# Patient Record
Sex: Female | Born: 1950 | Race: White | Hispanic: No | Marital: Married | Smoking: Never smoker
Health system: Southern US, Community
[De-identification: ages and names within clinical notes are randomized; demographics above are authoritative.]

## PROBLEM LIST (undated history)

## (undated) DIAGNOSIS — E785 Hyperlipidemia, unspecified: Secondary | ICD-10-CM

## (undated) DIAGNOSIS — K589 Irritable bowel syndrome without diarrhea: Secondary | ICD-10-CM

## (undated) DIAGNOSIS — K2270 Barrett's esophagus without dysplasia: Secondary | ICD-10-CM

## (undated) DIAGNOSIS — F329 Major depressive disorder, single episode, unspecified: Secondary | ICD-10-CM

## (undated) DIAGNOSIS — M1990 Unspecified osteoarthritis, unspecified site: Secondary | ICD-10-CM

## (undated) DIAGNOSIS — J45909 Unspecified asthma, uncomplicated: Secondary | ICD-10-CM

## (undated) DIAGNOSIS — N809 Endometriosis, unspecified: Secondary | ICD-10-CM

## (undated) DIAGNOSIS — K219 Gastro-esophageal reflux disease without esophagitis: Secondary | ICD-10-CM

## (undated) DIAGNOSIS — I10 Essential (primary) hypertension: Secondary | ICD-10-CM

## (undated) DIAGNOSIS — I472 Ventricular tachycardia: Secondary | ICD-10-CM

## (undated) DIAGNOSIS — G2581 Restless legs syndrome: Secondary | ICD-10-CM

## (undated) DIAGNOSIS — K449 Diaphragmatic hernia without obstruction or gangrene: Secondary | ICD-10-CM

## (undated) DIAGNOSIS — F319 Bipolar disorder, unspecified: Secondary | ICD-10-CM

## (undated) DIAGNOSIS — I493 Ventricular premature depolarization: Secondary | ICD-10-CM

## (undated) DIAGNOSIS — R55 Syncope and collapse: Secondary | ICD-10-CM

## (undated) DIAGNOSIS — M797 Fibromyalgia: Secondary | ICD-10-CM

## (undated) DIAGNOSIS — F32A Depression, unspecified: Secondary | ICD-10-CM

## (undated) DIAGNOSIS — K5900 Constipation, unspecified: Secondary | ICD-10-CM

## (undated) DIAGNOSIS — I4729 Other ventricular tachycardia: Secondary | ICD-10-CM

## (undated) HISTORY — DX: Ventricular premature depolarization: I49.3

## (undated) HISTORY — DX: Unspecified asthma, uncomplicated: J45.909

## (undated) HISTORY — DX: Barrett's esophagus without dysplasia: K22.70

## (undated) HISTORY — PX: ABDOMINOPLASTY: SUR9

## (undated) HISTORY — PX: COLONOSCOPY: SHX174

## (undated) HISTORY — DX: Syncope and collapse: R55

## (undated) HISTORY — DX: Essential (primary) hypertension: I10

## (undated) HISTORY — DX: Depression, unspecified: F32.A

## (undated) HISTORY — DX: Endometriosis, unspecified: N80.9

## (undated) HISTORY — PX: UPPER GASTROINTESTINAL ENDOSCOPY: SHX188

## (undated) HISTORY — DX: Hyperlipidemia, unspecified: E78.5

## (undated) HISTORY — DX: Irritable bowel syndrome, unspecified: K58.9

## (undated) HISTORY — DX: Other ventricular tachycardia: I47.29

## (undated) HISTORY — DX: Bipolar disorder, unspecified: F31.9

## (undated) HISTORY — DX: Diaphragmatic hernia without obstruction or gangrene: K44.9

## (undated) HISTORY — DX: Ventricular tachycardia: I47.2

## (undated) HISTORY — DX: Constipation, unspecified: K59.00

## (undated) HISTORY — DX: Restless legs syndrome: G25.81

## (undated) HISTORY — DX: Fibromyalgia: M79.7

## (undated) HISTORY — DX: Major depressive disorder, single episode, unspecified: F32.9

## (undated) HISTORY — PX: APPENDECTOMY: SHX54

## (undated) HISTORY — DX: Gastro-esophageal reflux disease without esophagitis: K21.9

## (undated) HISTORY — PX: LAPAROSCOPIC TOTAL HYSTERECTOMY: SUR800

## (undated) HISTORY — DX: Unspecified osteoarthritis, unspecified site: M19.90

---

## 1998-10-02 ENCOUNTER — Other Ambulatory Visit: Admission: RE | Admit: 1998-10-02 | Discharge: 1998-10-02 | Payer: Self-pay | Admitting: Obstetrics & Gynecology

## 2000-04-27 ENCOUNTER — Other Ambulatory Visit: Admission: RE | Admit: 2000-04-27 | Discharge: 2000-04-27 | Payer: Self-pay | Admitting: Obstetrics & Gynecology

## 2000-08-18 ENCOUNTER — Ambulatory Visit (HOSPITAL_COMMUNITY): Admission: RE | Admit: 2000-08-18 | Discharge: 2000-08-18 | Payer: Self-pay | Admitting: Obstetrics & Gynecology

## 2001-06-05 ENCOUNTER — Other Ambulatory Visit: Admission: RE | Admit: 2001-06-05 | Discharge: 2001-06-05 | Payer: Self-pay | Admitting: Obstetrics & Gynecology

## 2002-02-25 ENCOUNTER — Encounter: Admission: RE | Admit: 2002-02-25 | Discharge: 2002-02-25 | Payer: Self-pay | Admitting: Internal Medicine

## 2002-02-25 ENCOUNTER — Encounter: Payer: Self-pay | Admitting: Internal Medicine

## 2002-05-13 ENCOUNTER — Ambulatory Visit (HOSPITAL_COMMUNITY): Admission: RE | Admit: 2002-05-13 | Discharge: 2002-05-13 | Payer: Self-pay | Admitting: Internal Medicine

## 2002-05-13 ENCOUNTER — Encounter: Payer: Self-pay | Admitting: Internal Medicine

## 2002-09-11 ENCOUNTER — Other Ambulatory Visit: Admission: RE | Admit: 2002-09-11 | Discharge: 2002-09-11 | Payer: Self-pay | Admitting: Obstetrics & Gynecology

## 2002-10-02 ENCOUNTER — Encounter: Admission: RE | Admit: 2002-10-02 | Discharge: 2002-10-02 | Payer: Self-pay | Admitting: Obstetrics & Gynecology

## 2002-10-02 ENCOUNTER — Encounter: Payer: Self-pay | Admitting: Obstetrics & Gynecology

## 2003-10-08 ENCOUNTER — Other Ambulatory Visit: Admission: RE | Admit: 2003-10-08 | Discharge: 2003-10-08 | Payer: Self-pay | Admitting: Obstetrics and Gynecology

## 2003-10-14 ENCOUNTER — Encounter: Admission: RE | Admit: 2003-10-14 | Discharge: 2003-10-14 | Payer: Self-pay | Admitting: Obstetrics & Gynecology

## 2003-11-18 ENCOUNTER — Ambulatory Visit (HOSPITAL_COMMUNITY): Admission: RE | Admit: 2003-11-18 | Discharge: 2003-11-18 | Payer: Self-pay | Admitting: Internal Medicine

## 2004-09-01 ENCOUNTER — Ambulatory Visit: Payer: Self-pay | Admitting: Internal Medicine

## 2004-10-05 ENCOUNTER — Ambulatory Visit: Payer: Self-pay | Admitting: Internal Medicine

## 2009-05-05 DIAGNOSIS — Z8669 Personal history of other diseases of the nervous system and sense organs: Secondary | ICD-10-CM

## 2009-05-05 DIAGNOSIS — Z862 Personal history of diseases of the blood and blood-forming organs and certain disorders involving the immune mechanism: Secondary | ICD-10-CM

## 2009-05-05 DIAGNOSIS — M1990 Unspecified osteoarthritis, unspecified site: Secondary | ICD-10-CM | POA: Insufficient documentation

## 2009-05-05 DIAGNOSIS — I472 Ventricular tachycardia: Secondary | ICD-10-CM

## 2009-05-05 DIAGNOSIS — M810 Age-related osteoporosis without current pathological fracture: Secondary | ICD-10-CM | POA: Insufficient documentation

## 2009-05-05 DIAGNOSIS — R7309 Other abnormal glucose: Secondary | ICD-10-CM | POA: Insufficient documentation

## 2009-05-05 DIAGNOSIS — Z8639 Personal history of other endocrine, nutritional and metabolic disease: Secondary | ICD-10-CM | POA: Insufficient documentation

## 2009-05-05 DIAGNOSIS — Z8719 Personal history of other diseases of the digestive system: Secondary | ICD-10-CM

## 2009-05-06 ENCOUNTER — Ambulatory Visit: Payer: Self-pay | Admitting: Cardiology

## 2009-05-06 DIAGNOSIS — R002 Palpitations: Secondary | ICD-10-CM | POA: Insufficient documentation

## 2009-05-06 DIAGNOSIS — E785 Hyperlipidemia, unspecified: Secondary | ICD-10-CM | POA: Insufficient documentation

## 2009-05-06 DIAGNOSIS — R079 Chest pain, unspecified: Secondary | ICD-10-CM | POA: Insufficient documentation

## 2009-05-12 ENCOUNTER — Telehealth (INDEPENDENT_AMBULATORY_CARE_PROVIDER_SITE_OTHER): Payer: Self-pay | Admitting: Radiology

## 2009-06-16 ENCOUNTER — Telehealth (INDEPENDENT_AMBULATORY_CARE_PROVIDER_SITE_OTHER): Payer: Self-pay

## 2009-06-17 ENCOUNTER — Ambulatory Visit: Payer: Self-pay

## 2009-06-17 ENCOUNTER — Encounter: Payer: Self-pay | Admitting: Cardiovascular Disease

## 2009-06-23 ENCOUNTER — Ambulatory Visit: Payer: Self-pay | Admitting: Cardiology

## 2009-06-23 DIAGNOSIS — I10 Essential (primary) hypertension: Secondary | ICD-10-CM | POA: Insufficient documentation

## 2009-10-15 ENCOUNTER — Encounter (INDEPENDENT_AMBULATORY_CARE_PROVIDER_SITE_OTHER): Payer: Self-pay | Admitting: *Deleted

## 2010-03-01 ENCOUNTER — Telehealth: Payer: Self-pay | Admitting: Internal Medicine

## 2010-03-10 ENCOUNTER — Encounter (INDEPENDENT_AMBULATORY_CARE_PROVIDER_SITE_OTHER): Payer: Self-pay | Admitting: *Deleted

## 2010-03-17 ENCOUNTER — Encounter (INDEPENDENT_AMBULATORY_CARE_PROVIDER_SITE_OTHER): Payer: Self-pay | Admitting: *Deleted

## 2010-03-24 ENCOUNTER — Ambulatory Visit: Payer: Self-pay | Admitting: Internal Medicine

## 2010-03-29 ENCOUNTER — Ambulatory Visit: Payer: Self-pay | Admitting: Internal Medicine

## 2010-11-14 ENCOUNTER — Encounter: Payer: Self-pay | Admitting: Specialist

## 2010-11-23 NOTE — Progress Notes (Signed)
Summary: Schedule Colonoscopy  Phone Note Outgoing Call Call back at [facility] Phone [phone]   Call placed by: Harlow Mares CMA Duncan Dull),  Mar 01, 2010 2:56 PM Call placed to: Patient Summary of Call: Left message on patients machine to call back. patient is due for colonoscopy. Initial call taken by: Harlow Mares CMA Duncan Dull),  Mar 01, 2010 2:56 PM  Follow-up for Phone Call        pt scheduled for colonoscopy on 03/29/2010 Follow-up by: Harlow Mares CMA (AAMA),  Mar 10, 2010 11:21 AM

## 2010-11-23 NOTE — Letter (Signed)
Summary: Previsit letter  [facility]  [address] [place], [place] [address]   Phone: [phone]  Fax: [phone]       03/10/2010 MRN: [id number]  [facility] [address] [place], [place]  [address]  Dear Ms. Czajka,  Welcome to the Gastroenterology Division at [facility].    You are scheduled to see a nurse for your pre-procedure visit on 03/24/2010 at 1:00pm on the 3rd floor at [facility], [address]  We ask that you try to arrive at our office 15 minutes prior to your appointment time to allow for check-in.  Your nurse visit will consist of discussing your medical and surgical history, your immediate family medical history, and your medications.    Please bring a complete list of all your medications or, if you prefer, bring the medication bottles and we will list them.  We will need to be aware of both prescribed and over the counter drugs.  We will need to know exact dosage information as well.  If you are on blood thinners (Coumadin, Plavix, Aggrenox, Ticlid, etc.) please call our office today/prior to your appointment, as we need to consult with your physician about holding your medication.   Please be prepared to read and sign documents such as consent forms, a financial agreement, and acknowledgement forms.  If necessary, and with your consent, a friend or relative is welcome to sit-in on the nurse visit with you.  Please bring your insurance card so that we may make a copy of it.  If your insurance requires a referral to see a specialist, please bring your referral form from your primary care physician.  No co-pay is required for this nurse visit.     If you cannot keep your appointment, please call [phone] to cancel or reschedule prior to your appointment date.  This allows [place] the opportunity to schedule an appointment for another patient in need of care.    Thank you for choosing [facility] for your medical needs.  We  appreciate the opportunity to care for you.  Please visit [place] at our website  to learn more about our practice.                     Sincerely.                                                                                                                   The Gastroenterology Division

## 2010-11-23 NOTE — Procedures (Signed)
Summary: Colonoscopy  Patient: Jill Howe Note: All result statuses are Final unless otherwise noted.  Tests: (1) Colonoscopy (COL)   COL Colonoscopy           DONE     [facility]     [address]     [place], [place]  [address]           COLONOSCOPY PROCEDURE REPORT           PATIENT:  Jill Howe, Jill Howe  MR#:  [id number]     BIRTHDATE:  1951/06/21, 59 yrs. old  GENDER:  female     ENDOSCOPIST:  Wilhemina Bonito. Eda Keys, MD     REF. BY:  Screening Recall     PROCEDURE DATE:  03/29/2010     PROCEDURE:  Higher-risk screening colonoscopy G0105           ASA CLASS:  Class II     INDICATIONS:  family history of colon cancer ; Multiple paternal     relatives including father in 95's (negative index exam 12-05)     MEDICATIONS:   Fentanyl 100 mcg IV, Versed 10 mg IV           DESCRIPTION OF PROCEDURE:   After the risks benefits and     alternatives of the procedure were thoroughly explained, informed     consent was obtained.  Digital rectal exam was performed and     revealed no abnormalities.   The LB PCF-H180AL [id number] endoscope     was introduced through the anus and advanced to the cecum, which     was identified by both the appendix and ileocecal valve, without     limitations.Time to cecum = 14:15 min. The quality of the prep was     excellent, using MoviPrep.  The instrument was then slowly     withdrawn (time = 10:51 min) as the colon was fully examined.     <<PROCEDUREIMAGES>>           FINDINGS:  A normal appearing cecum, ileocecal valve, and     appendiceal orifice were identified. The ascending, hepatic     flexure, transverse, splenic flexure, descending, sigmoid colon,     and rectum appeared unremarkable.  No polyps or cancers were seen.     Retroflexed views in the rectum revealed hypertrophied anal     papillae.    The scope was then withdrawn from the patient and the     procedure completed.           COMPLICATIONS:  None     ENDOSCOPIC IMPRESSION:     1)  Normal colon     2) No polyps or cancers           RECOMMENDATIONS:     1) Follow up colonoscopy in 5 years           ______________________________     Wilhemina Bonito. Eda Keys, MD           CC:  Feliciana Rossetti, MD; The Patient           n.     eSIGNED:   Wilhemina Bonito. Eda Keys at 03/29/2010 02:03 PM           Lauris, Keepers, [phone]  Note: An exclamation mark (!) indicates a result that was not dispersed into the flowsheet. Document Creation Date: 03/29/2010 2:04 PM _______________________________________________________________________  (1) Order result status: Final Collection or observation date-time: 03/29/2010 13:56 Requested date-time:  Receipt date-time:  Reported date-time:  Referring Physician:   Ordering Physician: Fransico Setters [phone]) Specimen Source:  Source: Launa Grill Order Number: [id number] Lab site:   Appended Document: Colonoscopy    Clinical Lists Changes  Observations: Added new observation of COLONNXTDUE: 03/2015 (03/29/2010 16:09)

## 2010-11-23 NOTE — Letter (Signed)
Summary: [facility] Instructions  [facility]  [address] [place], [place] [address]   Phone: [phone]  Fax: [phone]       Jill Howe    1951-04-19    MRN: [id number]        Procedure Day Dorna Bloom:  Duanne Limerick  03/29/10     Arrival Time:  12:30PM     Procedure Time:  1:30PM     Location of Procedure:                    _X _  [facility] (4th Floor)                        PREPARATION FOR COLONOSCOPY WITH MOVIPREP   Starting 5 days prior to your procedure 03/24/10 do not eat nuts, seeds, popcorn, corn, beans, peas,  salads, or any raw vegetables.  Do not take any fiber supplements (e.g. Metamucil, Citrucel, and Benefiber).  THE DAY BEFORE YOUR PROCEDURE         DATE: 03/28/10  DAY: SUNDAY  1.  Drink clear liquids the entire day-NO SOLID FOOD  2.  Do not drink anything colored red or purple.  Avoid juices with pulp.  No orange juice.  3.  Drink at least 64 oz. (8 glasses) of fluid/clear liquids during the day to prevent dehydration and help the prep work efficiently.  CLEAR LIQUIDS INCLUDE: Water Jello Ice Popsicles Tea (sugar ok, no milk/cream) Powdered fruit flavored drinks Coffee (sugar ok, no milk/cream) Gatorade Juice: apple, Hutchinson grape, Greenley cranberry  Lemonade Clear bullion, consomm, broth Carbonated beverages (any kind) Strained chicken noodle soup Hard Candy                             4.  In the morning, mix first dose of MoviPrep solution:    Empty 1 Pouch A and 1 Pouch B into the disposable container    Add lukewarm drinking water to the top line of the container. Mix to dissolve    Refrigerate (mixed solution should be used within 24 hrs)  5.  Begin drinking the prep at 5:00 p.m. The MoviPrep container is divided by 4 marks.   Every 15 minutes drink the solution down to the next mark (approximately 8 oz) until the full liter is complete.   6.  Follow completed prep with 16 oz of clear liquid of your choice (Nothing red  or purple).  Continue to drink clear liquids until bedtime.  7.  Before going to bed, mix second dose of MoviPrep solution:    Empty 1 Pouch A and 1 Pouch B into the disposable container    Add lukewarm drinking water to the top line of the container. Mix to dissolve    Refrigerate  THE DAY OF YOUR PROCEDURE      DATE: 03/29/10  DAY: MONDAY  Beginning at 8:30AM (5 hours before procedure):         1. Every 15 minutes, drink the solution down to the next mark (approx 8 oz) until the full liter is complete.  2. Follow completed prep with 16 oz. of clear liquid of your choice.    3. You may drink clear liquids until 11:30AM (2 HOURS BEFORE PROCEDURE).   MEDICATION INSTRUCTIONS  Unless otherwise instructed, you should take regular prescription medications with a small sip of water   as early as possible the morning  of your procedure.        OTHER INSTRUCTIONS  You will need a responsible adult at least 59 years of age to accompany you and drive you home.   This person must remain in the waiting room during your procedure.  Wear loose fitting clothing that is easily removed.  Leave jewelry and other valuables at home.  However, you may wish to bring a book to read or  an iPod/MP3 player to listen to music as you wait for your procedure to start.  Remove all body piercing jewelry and leave at home.  Total time from sign-in until discharge is approximately 2-3 hours.  You should go home directly after your procedure and rest.  You can resume normal activities the  day after your procedure.  The day of your procedure you should not:   Drive   Make legal decisions   Operate machinery   Drink alcohol   Return to work  You will receive specific instructions about eating, activities and medications before you leave.    The above instructions have been reviewed and explained to me by   Wyona Almas RN  March 24, 2010 1:30 PM     I fully understand and can  verbalize these instructions _____________________________ Date _________

## 2010-11-23 NOTE — Miscellaneous (Signed)
Summary: [facility] Previsit/prep  Clinical Lists Changes  Medications: Added new medication of MOVIPREP 100 GM  SOLR (PEG-KCL-NACL-NASULF-NA ASC-C) As per prep instructions. - Signed Rx of MOVIPREP 100 GM  SOLR (PEG-KCL-NACL-NASULF-NA ASC-C) As per prep instructions.;  #1 x 0;  Signed;  Entered by: Wyona Almas RN;  Authorized by: Hilarie Fredrickson MD;  Method used: Electronically to [facility] [address] [place], [place]  [address], Ph: [phone], Fax: [phone] Observations: Added new observation of ALLERGY REV: Done (03/24/2010 13:01)    Prescriptions: MOVIPREP 100 GM  SOLR (PEG-KCL-NACL-NASULF-NA ASC-C) As per prep instructions.  #1 x 0   Entered by:   Wyona Almas RN   Authorized by:   Hilarie Fredrickson MD   Signed by:   Wyona Almas RN on 03/24/2010   Method used:   Electronically to        [facility]* (retail)       [address]       [place], [place]  [address]       Ph: [phone]       Fax: [phone]   RxID:   [id number]

## 2011-03-11 NOTE — Op Note (Signed)
NAME:  Jill Howe, Jill Howe                           ACCOUNT NO.:  [id number]   MEDICAL RECORD NO.:  [id number]                   PATIENT TYPE:  OIB   LOCATION:  2899                                 FACILITY:  [facility]   PHYSICIAN:  Doylene Canning. Ladona Ridgel, M.D.               DATE OF BIRTH:  [birth year]   DATE OF PROCEDURE:  11/18/2003  DATE OF DISCHARGE:                                 OPERATIVE REPORT   PROCEDURE PERFORMED:  Head up tilt table testing.   INDICATIONS FOR PROCEDURE:  Recurrent unexplained syncope and recurrent  episodes of near syncope.   INTRODUCTION:  The patient is a 60 year old woman referred by Pricilla Riffle,  M.D. for evaluation of syncope.  The patient has been previously healthy  although she does have problems with palpitations.  She has dyslipidemia.  She has had two episodes of syncope, one while sitting and one while  standing and recurrent episodes of near syncope which improved with sitting.  She also has prolonged palpitations with documented bigeminy and frequent  PVCs and even nonsustained VT despite normal LV systolic function.  She has  been treated in the past for these with Beta blockers.  She is now referred  for head up tilt table testing.   DESCRIPTION OF PROCEDURE:  After informed consent was obtained, the patient  was taken to the diagnostic catheterization lab in the fasting state.  She  was placed in the supine position.  Her initial blood pressure was 127/85  and her heart rate was 69.  After five minutes she was placed in 70 degree  head up tilt table position.  Her initial heart rate remained stable in the  60s to 70 range and then approximately four minutes of tilting, increased to  93 beats per minute.  Her blood pressure which initially had been in the 120  to 130 range remained stable.  After approximately 26 minutes of tilting,  the patient began to feel lightheaded and nauseated and had worsening  headache.  Her heart rate increased from the 80s  to the 90s.  The blood  pressure remained fairly stable, dropping slightly down to the 110 to 120  range.  She was maintained in this position for approximately 38 minutes  when she began to complain of blurry vision.  During this time, her heart  rate remained in the 80s and her blood pressure remained in the 120 to 130  range.  She continued to have symptoms of dizziness and nausea but  maintained her heart rate and blood pressure.  In fact her blood pressure  actually increased up into the 140 to 150 range and her heart rate increased  to the 100 to 110 range.  At approximately 45 minutes into tilting, the  patient complained of dizziness and was quite tearful and became  unresponsive.  The nurses note that her eyes rolled back into her head.  During this time, her heart rate remained in the 100 range and her blood  pressure remained in the 150 to 170 range.  She was placed back into supine  position.  She was quite tearful.  She remained somewhat nauseated.  After  approximately five minutes, she had a return of her heart rate into the 80s  and her blood pressure into the 130 range.  She was then returned to her  room in satisfactory condition.   COMPLICATIONS:  There were no immediate procedural complications.   RESULTS:  This study demonstrates a reproduction of the patient's symptoms  of syncope; however, it failed to demonstrate any hemodynamic changes  consistent with an early mediated syncope.  The heart rate and blood  pressure did not drop and in fact remained somewhat increased during the  procedure.  Because of a reproduction of her symptoms and because she  maintained stable vitals during the procedure, it was deemed that insertion  of an implantable loop recorder would not be beneficial at this time in this  particular patient.                                               Doylene Canning. Ladona Ridgel, M.D.    GWT/MEDQ  D:  11/18/2003  T:  11/18/2003  Job:  [id number]   cc:   Pricilla Riffle, M.D.   Dr. Jennette Kettle   Dr. Brien Few, [facility]

## 2011-03-11 NOTE — Op Note (Signed)
[facility]  Patient:    Jill Howe, Jill Howe                          MRN: [id number] Proc. Date: 08/18/00 Adm. Date:  08/18/00 Attending:  Freddy Finner, M.D.                           Operative Report  PREOPERATIVE DIAGNOSES:       1. Complex cystic left adnexal mass, persistent                                  on ultrasound.                               2. Persistent left adnexal and pelvic pain.  POSTOPERATIVE DIAGNOSES:      1. Boggy slight enlargement of the uterus.                               2. Filmy omental adhesions to anterior abdominal                                  wall into right pelvic side wall and right                                  adnexa.                               3. Adhesion distal fallopian tube to sigmoid                                  colon with endometriotic implant at the                                  location of the adhesion.                               4. Small endometrioma of the left ovary.  OPERATIVE PROCEDURE:          1. Diagnostic laparoscopy.                               2. Lysis of omental adhesions to the anterior                                  abdominal wall.                               3. Lysis of adhesions from the distal  fallopian tube to the sigmoid colon.                               4. Fulguration of endometriosis at this                                  location.                               5. Fulguration and drainage of endometrioma                                  of the left ovary.  SURGEON:                      Freddy Finner, M.D.  ANESTHESIA:                   General endotracheal.  ESTIMATED INTRAOPERATIVE BLOOD LOSS:  Less than 20 cc.  INTRAOPERATIVE COMPLICATIONS:  None.  INDICATIONS:                  The patient is a 60 year old [name] married female who has previously had a surgical resection of the right fallopian tube and ovary.  In  the recent past she has had persistent left adnexal pain and a small complex cystic mass in the left adnexa.  She has had consistent dyspareunia.  She has elected to proceed a with diagnostic laparoscopy.  INTRAOPERATIVE FINDINGS:      Are described in the postoperative diagnoses. The findings were recorded and still photographs were taken of the office record.  DESCRIPTION OF PROCEDURE:     The patient was admitted on the morning of surgery and brought to the operating room and placed under adequate general endotracheal anesthesia.  She was placed in the dorsal lithotomy position using the [employer] system.  Betadine prep of the abdomen, perineum, and vagina was carried out in the usual fashion.  The bladder was evaluated with the [facility] catheter using a sterile technique.  The Hulka tenaculum was attached to the cervix under direct visualization.  Sterile drapes were applied.  Two small incisions were made, one at the umbilicus, and one just above the symphysis.  A 10.0 mm trocar was introduced through the umbilical incision while elevating the anterior abdominal wall manually.  Direct inspection revealed adequate placement with no evidence of injury on entry. The pneumoperitoneum was allowed to accumulate using carbon dioxide gas.  A second trocar was placed through the lower incision, which was just above the symphysis.  At this location the 5.0 mm trocar was used.  A blunt probe, grasping forceps, and later the Nezhat irrigation system was used through this port.  After careful inspection of the pelvic and abdominal contents, the adhesions of the omentum to the anterior abdominal wall were sharply lysed where required.  Bipolar coagulation produced complete hemostasis.  The lesion attaching the distal infundibular pelvic ligament and tube to the sigmoid was fulgurated and divided sharply.  The cystic lesion there consistent with endometriosis was fulgurated.  A small bluish  cystic area on the proximal portion of the ovary just distal to the utero-ovarian ligament was fulgurated, and immediate ruptured, with chocolate-like material, total volume  of less than 1.0 cc.  Fulguration was continued until it was felt that the lesion was completely destroyed.  Irrigation using lactated Ringers solution through the Nezhat probe was used to cleanse the pelvic structures and to evaluate for hemostasis which was confirmed under irrigation and under reduced intra-abdominal pressure.  At this point all irrigation solution was aspirated from the abdomen.  The instruments were removed.  The tissue at the incision sites was injected with 0.5% plain Marcaine for postoperative analgesia.  The umbilical incision was closed with interrupted subcuticular sutures of #3-0 Dexon.  Dermabond was then applied over the incision.  The lower incision was closed with Dermabond alone.  The patient was awakened and taken to the recovery room in good condition. She will be discharged with routine outpatient surgical instructions, for followup in the office in two weeks.  She is given Darvocet to be taken as needed for postoperative pain. DD:  08/18/00 TD:  08/18/00 Job: [id number]

## 2013-04-05 ENCOUNTER — Telehealth: Payer: Self-pay | Admitting: Internal Medicine

## 2013-04-05 NOTE — Telephone Encounter (Signed)
Pt states she is having a lot of problems with constipation. States that she feels something hard and to the left in her rectum. States that when she tries to have a BM she uses an enema and she is even having problems getting the enema in her rectum. Also reports that when she does have a BM it is so large in diameter she cannot flush it. Pt requesting to be seen. Pt scheduled to see Amy Esterwood PA 04/10/13@1 :30pm.

## 2013-04-10 ENCOUNTER — Encounter: Payer: Self-pay | Admitting: *Deleted

## 2013-04-10 ENCOUNTER — Ambulatory Visit: Payer: Self-pay | Admitting: Physician Assistant

## 2013-04-11 ENCOUNTER — Ambulatory Visit (INDEPENDENT_AMBULATORY_CARE_PROVIDER_SITE_OTHER): Payer: Self-pay | Admitting: Physician Assistant

## 2013-04-11 ENCOUNTER — Encounter: Payer: Self-pay | Admitting: Physician Assistant

## 2013-04-11 VITALS — BP 114/68 | HR 72 | Ht 62.5 in | Wt 124.1 lb

## 2013-04-11 DIAGNOSIS — R198 Other specified symptoms and signs involving the digestive system and abdomen: Secondary | ICD-10-CM

## 2013-04-11 DIAGNOSIS — K5900 Constipation, unspecified: Secondary | ICD-10-CM

## 2013-04-11 NOTE — Progress Notes (Signed)
Subjective:    Patient ID: Jill Howe, female    DOB: 1951-09-15, 62 y.o.   MRN: [id number]  HPI  Jill Howe is a very nice 62 year old [place] female known to Dr. Marina Goodell from colonoscopy done in June of 2011. This was a normal exam with the exception of remarkable of hypertrophied anal papillae. She does have family history of colon cancer in her father diagnosed in his 61 . Patient says that she has chronic history of constipation dating back as long as she can remember she has tried multiple medications in the past without any benefit according to her. This included MiraLax and on Amitiza. She says that her symptoms have become progressively worse over the past several months and she is now going 10-12 days without a bowel movement and even then has to use enemas and do manual manipulation of the rectum to expel any stool.She has just had an episode going 13 days without a bowel movement and finally last evening with enemas etc. she was able to pass Hard large balls of stool followed by a large amount of more normal appearing formed stool. she says she frequently has bleeding with bowel movements because of straining using enemas etc.. She just had an abdominal plasty done about 4 weeks ago but says she was not taking any pain medication etc. She does take a stool softener generally 4 tablets once daily . She describes a sensation of a fullness in her left rectum which is pushing the stool to the right. She says she consents this when she is sitting and also when she is trying to eliminate stool She is status post hysterectomy.      Review of Systems  Constitutional: Negative.   HENT: Negative.   Respiratory: Negative.   Cardiovascular: Negative.   Gastrointestinal: Positive for constipation, anal bleeding and rectal pain.  Endocrine: Negative.   Genitourinary: Negative.   Hematological: Negative.   Psychiatric/Behavioral: Negative.    Outpatient Encounter Prescriptions as of 04/11/2013   Medication Sig Dispense Refill  . estradiol (ESTRACE) 1 MG tablet Take 1 mg by mouth daily.       . hydrochlorothiazide (HYDRODIURIL) 25 MG tablet Take 25 mg by mouth daily.      Marland Kitchen lisinopril (PRINIVIL,ZESTRIL) 20 MG tablet Take 20 mg by mouth daily.      . methocarbamol (ROBAXIN) 500 MG tablet Take 500 mg by mouth as needed.       . simvastatin (ZOCOR) 20 MG tablet Take 20 mg by mouth daily.       Marland Kitchen TAZORAC 0.1 % gel Apply topically as needed.       . [DISCONTINUED] enoxaparin (LOVENOX) 40 MG/0.4ML injection       . [DISCONTINUED] HYDROmorphone (DILAUDID) 2 MG tablet        No facility-administered encounter medications on file as of 04/11/2013.    Allergies  Allergen Reactions  . Bextra (Valdecoxib)        Patient Active Problem List   Diagnosis Date Noted  . HYPERTENSION, UNSPECIFIED 06/23/2009  . HYPERLIPIDEMIA-MIXED 05/06/2009  . PALPITATIONS 05/06/2009  . CHEST PAIN UNSPECIFIED 05/06/2009  . VENTRICULAR TACHYCARDIA 05/05/2009  . DEGENERATIVE JOINT DISEASE 05/05/2009  . OSTEOPOROSIS 05/05/2009  . DIABETES MELLITUS, BORDERLINE 05/05/2009  . HYPERPARATHYROIDISM, HX OF 05/05/2009  . SYNCOPE, HX OF 05/05/2009  . GASTROESOPHAGEAL REFLUX DISEASE, HX OF 05/05/2009   History   Social History Narrative  . No narrative on file   family history includes Breast cancer in her cousin; Colon  cancer in her cousin, father, and paternal aunt; Crohn's disease in her cousin; Diabetes in her father and paternal aunt; Heart attack (age of onset: 57) in her father; Heart attack (age of onset: 80) in her mother; Heart disease in her mother; Lymphoma in her brother; and Stroke in her brother and mother.  Objective:   Physical Exam  well-developed [place] female in no acute distress, pleasant blood pressure 114/68 pulse 72 height 5 foot 2 weight 124. HEENT ;nontraumatic normocephalic EOMI PERRLA sclera anicteric,neck; Supple no JVD, Cardiovascular; regular rate and rhythm with S1-S2 no murmur rub  or gallop, Pulmonary; clear bilaterally. , Abdomen ;soft, she has a large transverse incisional scar from recent abdominal plasty, abdomen is benign nondistended- bowel sounds are active there is no palpable mass or hepatosplenomegaly., Rectal; no external hemorrhoids noted nontender on anoscopy cannot palpate fissure, no internal hemorrhoids noted. On digital exam she does have fullness in the left rectal wall which is tender with palpation but not fluctuant. No stool in the rectal vault to Hemoccult, Extremities;no clubbing cyanosis or edema skin warm and dry, Psych; mood and affect normal and appropriate        Assessment & Plan:  #102 62 year old [place] female with chronic constipation now severe and progressive. Patient has difficulty evacuating her bowels and requires manual maneuvers etc. suggesting a component of pelvic floor dysfunction or possible rectocele. #2 abnormal rectal exam with fullness in the left rectal wall. Patient also sentences that her stool is being pushed to the right. Unclear etiology need to rule out a submucosal lesion #3 positive family history of colon cancer-patient had normal colonoscopy June 2011  Plan; start Linzess 290 mcg by mouth daily patient was provided with samples Patient is instructed to take a bottle of mag citrate or 4-5 doses of MiraLax in one sitting every 5 days if she has not had a bowel movement. Will schedule for pelvic CT with rectal contrast She may need repeat colonoscopy with Dr. Marina Goodell depending on results of above.

## 2013-04-11 NOTE — Patient Instructions (Addendum)
Your CT is scheduled on 04/12/2013 at 2:30pm at [facility] CT on [address] Arrive 15 minutes before your appointment  2 bottles of contrast   Drink one bottle at 12:30pm Drink one bottle at 1:30pm  Samples of Linzess 290 Use magnesium citrate for constipation if that does not work try 5 doses of Miralax

## 2013-04-12 ENCOUNTER — Inpatient Hospital Stay: Admission: RE | Admit: 2013-04-12 | Payer: Self-pay | Source: Ambulatory Visit

## 2013-04-15 ENCOUNTER — Ambulatory Visit (INDEPENDENT_AMBULATORY_CARE_PROVIDER_SITE_OTHER)
Admission: RE | Admit: 2013-04-15 | Discharge: 2013-04-15 | Disposition: A | Discharge status: Home / Self Care | Payer: Medicare Other | Source: Ambulatory Visit | Attending: Physician Assistant | Admitting: Physician Assistant

## 2013-04-15 DIAGNOSIS — R198 Other specified symptoms and signs involving the digestive system and abdomen: Secondary | ICD-10-CM

## 2013-04-15 MED ORDER — IOHEXOL 300 MG/ML  SOLN
100.0000 mL | Freq: Once | INTRAMUSCULAR | Status: AC | PRN
  Administered 2013-04-15: 100 mL via INTRAVENOUS

## 2013-04-15 NOTE — Progress Notes (Signed)
Agree with initial assessment and plans as outlined. Amy, keep me apprised of results.

## 2013-04-16 ENCOUNTER — Encounter: Payer: Self-pay | Admitting: Internal Medicine

## 2013-04-16 ENCOUNTER — Other Ambulatory Visit: Payer: Self-pay | Admitting: *Deleted

## 2013-04-16 DIAGNOSIS — K5900 Constipation, unspecified: Secondary | ICD-10-CM

## 2013-04-16 DIAGNOSIS — R198 Other specified symptoms and signs involving the digestive system and abdomen: Secondary | ICD-10-CM

## 2013-04-18 ENCOUNTER — Ambulatory Visit (AMBULATORY_SURGERY_CENTER): Payer: Medicare Other | Admitting: *Deleted

## 2013-04-18 ENCOUNTER — Encounter: Payer: Self-pay | Admitting: Internal Medicine

## 2013-04-18 VITALS — Ht 63.0 in | Wt 124.0 lb

## 2013-04-18 DIAGNOSIS — R198 Other specified symptoms and signs involving the digestive system and abdomen: Secondary | ICD-10-CM

## 2013-04-18 DIAGNOSIS — Z800 Family history of malignant neoplasm of digestive organs: Secondary | ICD-10-CM

## 2013-04-18 DIAGNOSIS — K5900 Constipation, unspecified: Secondary | ICD-10-CM

## 2013-04-18 MED ORDER — MOVIPREP 100 G PO SOLR: 1.0000 | Freq: Once | ORAL | Status: DC

## 2013-04-18 NOTE — Progress Notes (Signed)
Denies allergies to eggs or soy products. Denies complications with sedation or anesthesia. 

## 2013-04-24 ENCOUNTER — Ambulatory Visit (AMBULATORY_SURGERY_CENTER): Payer: Medicare Other | Admitting: Internal Medicine

## 2013-04-24 ENCOUNTER — Encounter: Payer: Self-pay | Admitting: Internal Medicine

## 2013-04-24 VITALS — BP 137/72 | HR 67 | Temp 97.9°F | Resp 14 | Ht 63.0 in | Wt 124.0 lb

## 2013-04-24 DIAGNOSIS — K5900 Constipation, unspecified: Secondary | ICD-10-CM

## 2013-04-24 DIAGNOSIS — R198 Other specified symptoms and signs involving the digestive system and abdomen: Secondary | ICD-10-CM

## 2013-04-24 DIAGNOSIS — Z800 Family history of malignant neoplasm of digestive organs: Secondary | ICD-10-CM

## 2013-04-24 MED ORDER — SODIUM CHLORIDE 0.9 % IV SOLN: 500.0000 mL | INTRAVENOUS | Status: DC

## 2013-04-24 NOTE — Progress Notes (Signed)
Lidocaine-40mg IV prior to Propofol InductionPropofol given over incremental dosages 

## 2013-04-24 NOTE — Progress Notes (Signed)
No complaints noted in the recovery room. maw 

## 2013-04-24 NOTE — Patient Instructions (Addendum)
Refer to [facility] GI motility disorder section for refractory constipation.  Follow up colonoscopy for 5 years.  You may resume your current medications today.  Please call if any questions or concerns.    YOU HAD AN ENDOSCOPIC PROCEDURE TODAY AT THE [facility]: Refer to the procedure report that was given to you for any specific questions about what was found during the examination.  If the procedure report does not answer your questions, please call your gastroenterologist to clarify.  If you requested that your care partner not be given the details of your procedure findings, then the procedure report has been included in a sealed envelope for you to review at your convenience later.  YOU SHOULD EXPECT: Some feelings of bloating in the abdomen. Passage of more gas than usual.  Walking can help get rid of the air that was put into your GI tract during the procedure and reduce the bloating. If you had a lower endoscopy (such as a colonoscopy or flexible sigmoidoscopy) you may notice spotting of blood in your stool or on the toilet paper. If you underwent a bowel prep for your procedure, then you may not have a normal bowel movement for a few days.  DIET: Your first meal following the procedure should be a light meal and then it is ok to progress to your normal diet.  A half-sandwich or bowl of soup is an example of a good first meal.  Heavy or fried foods are harder to digest and may make you feel nauseous or bloated.  Likewise meals heavy in dairy and vegetables can cause extra gas to form and this can also increase the bloating.  Drink plenty of fluids but you should avoid alcoholic beverages for 24 hours.  ACTIVITY: Your care partner should take you home directly after the procedure.  You should plan to take it easy, moving slowly for the rest of the day.  You can resume normal activity the day after the procedure however you should NOT DRIVE or use heavy machinery for 24 hours (because of  the sedation medicines used during the test).    SYMPTOMS TO REPORT IMMEDIATELY: A gastroenterologist can be reached at any hour.  During normal business hours, 8:30 AM to 5:00 PM Monday through Friday, call [phone].  After hours and on weekends, please call the GI answering service at [phone] who will take a message and have the physician on call contact you.   Following lower endoscopy (colonoscopy or flexible sigmoidoscopy):  Excessive amounts of blood in the stool  Significant tenderness or worsening of abdominal pains  Swelling of the abdomen that is new, acute  Fever of 100F or higher    FOLLOW UP: If any biopsies were taken you will be contacted by phone or by letter within the next 1-3 weeks.  Call your gastroenterologist if you have not heard about the biopsies in 3 weeks.  Our staff will call the home number listed on your records the next business day following your procedure to check on you and address any questions or concerns that you may have at that time regarding the information given to you following your procedure. This is a courtesy call and so if there is no answer at the home number and we have not heard from you through the emergency physician on call, we will assume that you have returned to your regular daily activities without incident.  SIGNATURES/CONFIDENTIALITY: You and/or your care partner have signed paperwork which will  be entered into your electronic medical record.  These signatures attest to the fact that that the information above on your After Visit Summary has been reviewed and is understood.  Full responsibility of the confidentiality of this discharge information lies with you and/or your care-partner. 

## 2013-04-24 NOTE — Op Note (Signed)
[facility] [address] [place], [address]   COLONOSCOPY PROCEDURE REPORT  PATIENT: Jill, Howe  MR#: [id number] BIRTHDATE: 08-Apr-1951 , 62  yrs. old GENDER: Female ENDOSCOPIST: Roxy Cedar, MD REFERRED BY:.  Self / Office PROCEDURE DATE:  04/24/2013 PROCEDURE:   Colonoscopy, diagnostic ASA CLASS:   Class II INDICATIONS:Constipation, Change in bowel habits, and Patient's immediate family history of colon cancer (father 75's; multiple paternal relatives.) NEGATIVE PRIOR EXAM 2005 AND 2010. MEDICATIONS: MAC sedation, administered by CRNA and propofol (Diprivan) 200mg  IV  DESCRIPTION OF PROCEDURE:   After the risks benefits and alternatives of the procedure were thoroughly explained, informed consent was obtained.  A digital rectal exam revealed no abnormalities of the rectum.   The LB [id number]  endoscope was introduced through the anus and advanced to the cecum, which was identified by both the appendix and ileocecal valve. No adverse events experienced.   The quality of the prep was adequate, using MoviPrep  The instrument was then slowly withdrawn as the colon was fully examined.      COLON FINDINGS: A normal appearing cecum, ileocecal valve, and appendiceal orifice were identified.  The ascending, hepatic flexure, transverse, splenic flexure, descending, sigmoid colon and rectum appeared unremarkable.  No polyps or cancers were seen. Retroflexed views revealed internal hemorrhoids. The time to cecum=5 minutes 23 seconds.  Withdrawal time=8 minutes 43 seconds. The scope was withdrawn and the procedure completed. COMPLICATIONS: There were no complications.  ENDOSCOPIC IMPRESSION: 1. Normal colon 2. Chronic worsening constipation refractory to medical therapies/agents  RECOMMENDATIONS: 1. Follow up colonoscopy in 5 years (family hx) 2. Refer to [facility] GI motility disorder section (Drs. Diona Browner, or colleagues) "Refeactory  constipation'   eSigned:  Roxy Cedar, MD 04/24/2013 2:09 PM   cc: Feliciana Rossetti, MD and The Patient

## 2013-04-25 ENCOUNTER — Telehealth: Payer: Self-pay

## 2013-04-25 NOTE — Progress Notes (Signed)
The following G-Codes are for the date of service 04-24-2013:  Patient did not experience any of the following events: a burn prior to discharge; a fall within the facility; wrong site/side/patient/procedure/implant event; or a hospital transfer or hospital admission upon discharge from the facility. (G8907)Patient did not have preoperative order for IV antibiotic SSI prophylaxis. (G8918) 

## 2013-04-25 NOTE — Telephone Encounter (Signed)
Left message on answering machine. 

## 2013-04-30 ENCOUNTER — Telehealth: Payer: Self-pay

## 2013-04-30 ENCOUNTER — Other Ambulatory Visit: Payer: Self-pay | Admitting: Internal Medicine

## 2013-04-30 DIAGNOSIS — K599 Functional intestinal disorder, unspecified: Secondary | ICD-10-CM

## 2013-04-30 NOTE — Telephone Encounter (Signed)
Pt referred to [facility] last week for refractory constipation. Called [facility] to check on referral. [facility] has called pt and left a message for the pt to call back and schedule the appt.

## 2016-03-22 ENCOUNTER — Encounter: Payer: Self-pay | Admitting: Internal Medicine

## 2017-09-05 ENCOUNTER — Other Ambulatory Visit: Payer: Self-pay | Admitting: Obstetrics & Gynecology

## 2017-09-05 DIAGNOSIS — Z1231 Encounter for screening mammogram for malignant neoplasm of breast: Secondary | ICD-10-CM

## 2017-09-08 ENCOUNTER — Other Ambulatory Visit: Payer: Self-pay | Admitting: Specialist

## 2017-09-08 DIAGNOSIS — Z139 Encounter for screening, unspecified: Secondary | ICD-10-CM

## 2017-09-08 DIAGNOSIS — M816 Localized osteoporosis [Lequesne]: Secondary | ICD-10-CM

## 2017-09-11 ENCOUNTER — Other Ambulatory Visit: Payer: Self-pay | Admitting: Specialist

## 2017-09-11 DIAGNOSIS — Z780 Asymptomatic menopausal state: Secondary | ICD-10-CM

## 2017-11-24 ENCOUNTER — Other Ambulatory Visit: Payer: Self-pay | Admitting: Internal Medicine

## 2017-11-24 DIAGNOSIS — Z139 Encounter for screening, unspecified: Secondary | ICD-10-CM

## 2018-01-10 ENCOUNTER — Ambulatory Visit
Admission: RE | Admit: 2018-01-10 | Discharge: 2018-01-10 | Disposition: A | Discharge status: Home / Self Care | Payer: Medicare HMO | Source: Ambulatory Visit | Attending: Specialist | Admitting: Specialist

## 2018-01-10 ENCOUNTER — Ambulatory Visit
Admission: RE | Admit: 2018-01-10 | Discharge: 2018-01-10 | Disposition: A | Discharge status: Home / Self Care | Payer: Medicare HMO | Source: Ambulatory Visit | Attending: Internal Medicine | Admitting: Internal Medicine

## 2018-01-10 DIAGNOSIS — M816 Localized osteoporosis [Lequesne]: Secondary | ICD-10-CM

## 2018-01-10 DIAGNOSIS — Z139 Encounter for screening, unspecified: Secondary | ICD-10-CM

## 2018-03-14 ENCOUNTER — Encounter: Payer: Self-pay | Admitting: Internal Medicine

## 2018-08-15 ENCOUNTER — Other Ambulatory Visit: Payer: Self-pay | Admitting: Specialist

## 2018-08-15 DIAGNOSIS — J449 Chronic obstructive pulmonary disease, unspecified: Secondary | ICD-10-CM

## 2018-08-15 DIAGNOSIS — G43909 Migraine, unspecified, not intractable, without status migrainosus: Secondary | ICD-10-CM

## 2018-08-29 ENCOUNTER — Ambulatory Visit
Admission: RE | Admit: 2018-08-29 | Discharge: 2018-08-29 | Disposition: A | Discharge status: Home / Self Care | Payer: Medicare HMO | Source: Ambulatory Visit | Attending: Specialist | Admitting: Specialist

## 2018-08-29 DIAGNOSIS — J449 Chronic obstructive pulmonary disease, unspecified: Secondary | ICD-10-CM

## 2018-08-29 DIAGNOSIS — G43909 Migraine, unspecified, not intractable, without status migrainosus: Secondary | ICD-10-CM

## 2018-08-29 MED ORDER — IOPAMIDOL (ISOVUE-300) INJECTION 61%
75.0000 mL | Freq: Once | INTRAVENOUS | Status: AC | PRN
  Administered 2018-08-29: 75 mL via INTRAVENOUS

## 2018-09-06 ENCOUNTER — Other Ambulatory Visit: Payer: Self-pay | Admitting: Specialist

## 2018-09-06 DIAGNOSIS — I7090 Unspecified atherosclerosis: Secondary | ICD-10-CM

## 2018-09-18 ENCOUNTER — Ambulatory Visit
Admission: RE | Admit: 2018-09-18 | Discharge: 2018-09-18 | Disposition: A | Discharge status: Home / Self Care | Payer: Medicare HMO | Source: Ambulatory Visit | Attending: Specialist | Admitting: Specialist

## 2018-09-18 DIAGNOSIS — I7090 Unspecified atherosclerosis: Secondary | ICD-10-CM

## 2018-10-24 HISTORY — PX: OTHER SURGICAL HISTORY: SHX169

## 2018-12-19 ENCOUNTER — Other Ambulatory Visit: Payer: Self-pay | Admitting: Internal Medicine

## 2018-12-19 ENCOUNTER — Encounter: Payer: Self-pay | Admitting: Internal Medicine

## 2018-12-19 DIAGNOSIS — Z1231 Encounter for screening mammogram for malignant neoplasm of breast: Secondary | ICD-10-CM

## 2019-01-07 ENCOUNTER — Telehealth: Payer: Self-pay | Admitting: *Deleted

## 2019-01-07 NOTE — Telephone Encounter (Signed)
Covid-19 travel screening questions  Have you traveled in the last 14 days? No If yes where?   Do you now or have you had a fever in the last 14 days? No  Do you have any respiratory symptoms of shortness of breath or cough now or in the last 14 days? No  Do you have a medical history of Congestive Heart Failure? No  Do you have a medical history of lung disease? No  Do you have any family members or close contacts with diagnosed or suspected Covid-19? No        

## 2019-01-08 ENCOUNTER — Other Ambulatory Visit: Payer: Self-pay

## 2019-01-08 ENCOUNTER — Ambulatory Visit (AMBULATORY_SURGERY_CENTER): Payer: Self-pay

## 2019-01-08 VITALS — Ht 63.0 in | Wt 128.6 lb

## 2019-01-08 DIAGNOSIS — Z800 Family history of malignant neoplasm of digestive organs: Secondary | ICD-10-CM

## 2019-01-08 MED ORDER — NA SULFATE-K SULFATE-MG SULF 17.5-3.13-1.6 GM/177ML PO SOLN: 1.0000 | Freq: Once | ORAL | 0 refills | Status: AC

## 2019-01-08 NOTE — Progress Notes (Signed)
Per pt, no allergies to soy or egg products.Pt not taking any weight loss meds or using  O2 at home.  Pt refused emmi video.  Temp-98.9. Per pt, she denies any travel out of the country or any symptoms of any illness at this time.

## 2019-01-11 ENCOUNTER — Telehealth: Payer: Self-pay | Admitting: Internal Medicine

## 2019-01-11 NOTE — Telephone Encounter (Signed)
Hi Dr. Marina Goodell, pt just cancelled her colonoscopy that was scheduled on 3/24 with you due to concerns about Covid 19. She will call back to reschedule. Thank you.

## 2019-01-15 ENCOUNTER — Encounter: Payer: Medicare HMO | Admitting: Internal Medicine

## 2019-01-21 ENCOUNTER — Ambulatory Visit: Payer: Medicare HMO

## 2019-02-12 ENCOUNTER — Ambulatory Visit: Payer: Medicare Other

## 2019-03-06 ENCOUNTER — Telehealth: Payer: Self-pay | Admitting: *Deleted

## 2019-03-06 NOTE — Telephone Encounter (Signed)
Pt scheduled for 04-01-19 at 1:30 pm.  New instructions mailed to pt.  She states she still has the prep

## 2019-03-29 ENCOUNTER — Telehealth: Payer: Self-pay | Admitting: *Deleted

## 2019-03-29 NOTE — Telephone Encounter (Addendum)
Covid-19 screening questions  Have you traveled in the last 14 days? No. If yes where?  Do you now or have you had a fever in the last 14 days? No.  Do you have any respiratory symptoms of shortness of breath or cough now or in the last 14 days? No.  Do you have any family members or close contacts with diagnosed or suspected Covid-19 in the past 14 days? No.  Have you been tested for Covid-19 and found to be positive? No.       

## 2019-04-01 ENCOUNTER — Ambulatory Visit (AMBULATORY_SURGERY_CENTER): Payer: Medicare Other | Admitting: Internal Medicine

## 2019-04-01 ENCOUNTER — Other Ambulatory Visit: Payer: Self-pay

## 2019-04-01 ENCOUNTER — Encounter: Payer: Self-pay | Admitting: Internal Medicine

## 2019-04-01 VITALS — BP 118/58 | HR 70 | Temp 98.3°F | Resp 12 | Ht 63.0 in | Wt 128.6 lb

## 2019-04-01 DIAGNOSIS — Z800 Family history of malignant neoplasm of digestive organs: Secondary | ICD-10-CM | POA: Diagnosis not present

## 2019-04-01 DIAGNOSIS — Z1211 Encounter for screening for malignant neoplasm of colon: Secondary | ICD-10-CM | POA: Diagnosis not present

## 2019-04-01 MED ORDER — SODIUM CHLORIDE 0.9 % IV SOLN
500.0000 mL | Freq: Once | INTRAVENOUS | Status: DC
Start: 2019-04-01 — End: 2019-11-14

## 2019-04-01 NOTE — Op Note (Signed)
[facility] Patient Name: Jill Howe Procedure Date: 04/01/2019 12:41 PM MRN: [id number] Endoscopist: Docia Chuck. Henrene Pastor , MD Age: 68 Referring MD:  Date of Birth: 24-Nov-1950 Gender: Female Account #: [id number] Procedure:                Colonoscopy Indications:              Screening in patient at increased risk: Colorectal                            cancer in father 39 or older. Multiple paternal                            relatives as well. Previous examinations 1994,                            2003, 2005, 2011, 2014. All negative for neoplasia Medicines:                Monitored Anesthesia Care Procedure:                Pre-Anesthesia Assessment:                           - Prior to the procedure, a History and Physical                            was performed, and patient medications and                            allergies were reviewed. The patient's tolerance of                            previous anesthesia was also reviewed. The risks                            and benefits of the procedure and the sedation                            options and risks were discussed with the patient.                            All questions were answered, and informed consent                            was obtained. Prior Anticoagulants: The patient has                            taken no previous anticoagulant or antiplatelet                            agents. ASA Grade Assessment: II - A patient with                            mild systemic disease. After reviewing the risks  and benefits, the patient was deemed in                            satisfactory condition to undergo the procedure.                           After obtaining informed consent, the colonoscope                            was passed under direct vision. Throughout the                            procedure, the patient's blood pressure, pulse, and                            oxygen saturations  were monitored continuously. The                            Colonoscope was introduced through the anus and                            advanced to the the cecum, identified by                            appendiceal orifice and ileocecal valve. The                            ileocecal valve, appendiceal orifice, and rectum                            were photographed. The quality of the bowel                            preparation was excellent. The colonoscopy was                            performed without difficulty. The patient tolerated                            the procedure well. The bowel preparation used was                            SUPREP via split dose instruction. Scope In: 1:12:03 PM Scope Out: 1:35:02 PM Scope Withdrawal Time: 0 hours 10 minutes 57 seconds  Total Procedure Duration: 0 hours 22 minutes 59 seconds  Findings:                 The colon was tortuous. The entire examined colon                            appeared normal on direct and retroflexion views. Complications:            No immediate complications. Estimated blood loss:  None. Estimated Blood Loss:     Estimated blood loss: none. Impression:               - The entire examined colon is normal on direct and                            retroflexion views.                           - No specimens collected. Recommendation:           - Repeat colonoscopy in 5 years for surveillance.                           - Patient has a contact number available for                            emergencies. The signs and symptoms of potential                            delayed complications were discussed with the                            patient. Return to normal activities tomorrow.                            Written discharge instructions were provided to the                            patient.                           - Resume previous diet.                           - Continue present  medications. Wilhemina BonitoJohn N. Marina GoodellPerry, MD 04/01/2019 1:43:50 PM This report has been signed electronically.

## 2019-04-01 NOTE — Progress Notes (Signed)
Temp & covid questions by Loma Sousa & VS by Loel Ro

## 2019-04-01 NOTE — Patient Instructions (Signed)
YOU HAD AN ENDOSCOPIC PROCEDURE TODAY AT THE [facility]:   Refer to the procedure report that was given to you for any specific questions about what was found during the examination.  If the procedure report does not answer your questions, please call your gastroenterologist to clarify.  If you requested that your care partner not be given the details of your procedure findings, then the procedure report has been included in a sealed envelope for you to review at your convenience later.  YOU SHOULD EXPECT: Some feelings of bloating in the abdomen. Passage of more gas than usual.  Walking can help get rid of the air that was put into your GI tract during the procedure and reduce the bloating. If you had a lower endoscopy (such as a colonoscopy or flexible sigmoidoscopy) you may notice spotting of blood in your stool or on the toilet paper. If you underwent a bowel prep for your procedure, you may not have a normal bowel movement for a few days.  Please Note:  You might notice some irritation and congestion in your nose or some drainage.  This is from the oxygen used during your procedure.  There is no need for concern and it should clear up in a day or so.  SYMPTOMS TO REPORT IMMEDIATELY:   Following lower endoscopy (colonoscopy or flexible sigmoidoscopy):  Excessive amounts of blood in the stool  Significant tenderness or worsening of abdominal pains  Swelling of the abdomen that is new, acute  Fever of 100F or higher   For urgent or emergent issues, a gastroenterologist can be reached at any hour by calling [phone].   DIET:  We do recommend a small meal at first, but then you may proceed to your regular diet.  Drink plenty of fluids but you should avoid alcoholic beverages for 24 hours.  MEDICATIONS: Continue present medications.  ACTIVITY:  You should plan to take it easy for the rest of today and you should NOT DRIVE or use heavy machinery until tomorrow (because of  the sedation medicines used during the test).    FOLLOW UP: Our staff will call the number listed on your records 48-72 hours following your procedure to check on you and address any questions or concerns that you may have regarding the information given to you following your procedure. If we do not reach you, we will leave a message.  We will attempt to reach you two times.  During this call, we will ask if you have developed any symptoms of COVID 19. If you develop any symptoms (ie: fever, flu-like symptoms, shortness of breath, cough etc.) before then, please call [phone].  If you test positive for Covid 19 in the 2 weeks post procedure, please call and report this information to us.    If any biopsies were taken you will be contacted by phone or by letter within the next 1-3 weeks.  Please call us at [phone] if you have not heard about the biopsies in 3 weeks.   Thank you for allowing us to provide for your healthcare needs today.   SIGNATURES/CONFIDENTIALITY: You and/or your care partner have signed paperwork which will be entered into your electronic medical record.  These signatures attest to the fact that that the information above on your After Visit Summary has been reviewed and is understood.  Full responsibility of the confidentiality of this discharge information lies with you and/or your care-partner. 

## 2019-04-01 NOTE — Progress Notes (Signed)
PT taken to PACU. Monitors in place. VSS. Report given to RN. 

## 2019-04-03 ENCOUNTER — Telehealth: Payer: Self-pay

## 2019-04-03 NOTE — Telephone Encounter (Signed)
  Follow up Call-  Call back number 04/01/2019  Post procedure Call Back phone  # [phone]  Permission to leave phone message Yes  Some recent data might be hidden     Patient questions:  Do you have a fever, pain , or abdominal swelling? No. Pain Score  0 *  Have you tolerated food without any problems? Yes.    Have you been able to return to your normal activities? Yes.    Do you have any questions about your discharge instructions: Diet   No. Medications  No. Follow up visit  No.  Do you have questions or concerns about your Care? No.  Actions: * If pain score is 4 or above: No action needed, pain <4.   1. Have you developed a fever since your procedure? no  2.   Have you had an respiratory symptoms (SOB or cough) since your procedure? no  3.   Have you tested positive for COVID 19 since your procedure no  4.   Have you had any family members/close contacts diagnosed with the COVID 19 since your procedure?  no   If yes to any of these questions please route to Joylene John, RN and Alphonsa Gin, Therapist, sports.

## 2019-04-11 ENCOUNTER — Ambulatory Visit
Admission: RE | Admit: 2019-04-11 | Discharge: 2019-04-11 | Disposition: A | Discharge status: Home / Self Care | Payer: Medicare Other | Source: Ambulatory Visit | Attending: Internal Medicine | Admitting: Internal Medicine

## 2019-04-11 ENCOUNTER — Other Ambulatory Visit: Payer: Self-pay

## 2019-04-11 DIAGNOSIS — Z1231 Encounter for screening mammogram for malignant neoplasm of breast: Secondary | ICD-10-CM

## 2019-06-28 ENCOUNTER — Other Ambulatory Visit: Payer: Self-pay

## 2019-09-26 ENCOUNTER — Telehealth: Payer: Self-pay | Admitting: Internal Medicine

## 2019-09-26 ENCOUNTER — Encounter: Payer: Self-pay | Admitting: Physician Assistant

## 2019-09-26 ENCOUNTER — Ambulatory Visit (INDEPENDENT_AMBULATORY_CARE_PROVIDER_SITE_OTHER): Payer: Medicare Other | Admitting: Physician Assistant

## 2019-09-26 ENCOUNTER — Other Ambulatory Visit (INDEPENDENT_AMBULATORY_CARE_PROVIDER_SITE_OTHER): Payer: Medicare Other

## 2019-09-26 VITALS — BP 122/70 | HR 86 | Temp 98.2°F | Ht 63.0 in | Wt 120.4 lb

## 2019-09-26 DIAGNOSIS — R1084 Generalized abdominal pain: Secondary | ICD-10-CM | POA: Diagnosis not present

## 2019-09-26 DIAGNOSIS — K921 Melena: Secondary | ICD-10-CM

## 2019-09-26 DIAGNOSIS — R110 Nausea: Secondary | ICD-10-CM

## 2019-09-26 LAB — CBC WITH DIFFERENTIAL/PLATELET
Basophils Absolute: 0 10*3/uL (ref 0.0–0.1)
Basophils Relative: 0.3 % (ref 0.0–3.0)
Eosinophils Absolute: 0 10*3/uL (ref 0.0–0.7)
Eosinophils Relative: 0.1 % (ref 0.0–5.0)
HCT: 38.5 % (ref 36.0–46.0)
Hemoglobin: 13 g/dL (ref 12.0–15.0)
Lymphocytes Relative: 8.8 % — ABNORMAL LOW (ref 12.0–46.0)
Lymphs Abs: 1.2 10*3/uL (ref 0.7–4.0)
MCHC: 33.7 g/dL (ref 30.0–36.0)
MCV: 89.5 fl (ref 78.0–100.0)
Monocytes Absolute: 0.9 10*3/uL (ref 0.1–1.0)
Monocytes Relative: 6.5 % (ref 3.0–12.0)
Neutro Abs: 12 10*3/uL — ABNORMAL HIGH (ref 1.4–7.7)
Neutrophils Relative %: 84.3 % — ABNORMAL HIGH (ref 43.0–77.0)
Platelets: 332 10*3/uL (ref 150.0–400.0)
RBC: 4.3 Mil/uL (ref 3.87–5.11)
RDW: 12.2 % (ref 11.5–15.5)
WBC: 14.2 10*3/uL — ABNORMAL HIGH (ref 4.0–10.5)

## 2019-09-26 LAB — COMPREHENSIVE METABOLIC PANEL
ALT: 22 U/L (ref 0–35)
AST: 24 U/L (ref 0–37)
Albumin: 4.6 g/dL (ref 3.5–5.2)
Alkaline Phosphatase: 62 U/L (ref 39–117)
BUN: 22 mg/dL (ref 6–23)
CO2: 27 mEq/L (ref 19–32)
Calcium: 10 mg/dL (ref 8.4–10.5)
Chloride: 91 mEq/L — ABNORMAL LOW (ref 96–112)
Creatinine, Ser: 1.06 mg/dL (ref 0.40–1.20)
GFR: 51.61 mL/min — ABNORMAL LOW (ref >60.00)
Glucose, Bld: 89 mg/dL (ref 70–99)
Potassium: 4.5 mEq/L (ref 3.5–5.1)
Sodium: 132 mEq/L — ABNORMAL LOW (ref 135–145)
Total Bilirubin: 0.7 mg/dL (ref 0.2–1.2)
Total Protein: 7.2 g/dL (ref 6.0–8.3)

## 2019-09-26 NOTE — Patient Instructions (Signed)
If you are age 67 or older, your body mass index should be between 23-30. Your Body mass index is 21.33 kg/m. If this is out of the aforementioned range listed, please consider follow up with your Primary Care Provider.  If you are age 34 or younger, your body mass index should be between 19-25. Your Body mass index is 21.33 kg/m. If this is out of the aformentioned range listed, please consider follow up with your Primary Care Provider.   You have been scheduled for a CT scan of the abdomen and pelvis at [facility] ([address]---this is in the same building as Press [facility]).   You are scheduled on 09/27/2019 at 8:30. You should arrive 15 minutes prior to your appointment time for registration. Please follow the written instructions below on the day of your exam:  WARNING: IF YOU ARE ALLERGIC TO IODINE/X-RAY DYE, PLEASE NOTIFY RADIOLOGY IMMEDIATELY AT [phone]! YOU WILL BE GIVEN A 13 HOUR PREMEDICATION PREP.  1) Do not eat or drink anything after midnight  2) You have been given 2 bottles of oral contrast to drink. The solution may taste better if refrigerated, but do NOT add ice or any other liquid to this solution. Shake well before drinking.    Drink 1 bottle of contrast @ 6:30 am (2 hours prior to your exam)  Drink 1 bottle of contrast @ 7:30 am (1 hour prior to your exam)  You may take any medications as prescribed with a small amount of water, if necessary. If you take any of the following medications: METFORMIN, GLUCOPHAGE, GLUCOVANCE, AVANDAMET, RIOMET, FORTAMET, Veteran MET, JANUMET, GLUMETZA or METAGLIP, you MAY be asked to HOLD this medication 48 hours AFTER the exam.  The purpose of you drinking the oral contrast is to aid in the visualization of your intestinal tract. The contrast solution may cause some diarrhea. Depending on your individual set of symptoms, you may also receive an intravenous injection of x-ray contrast/dye. Plan on being at [facility] for 30 minutes or longer, depending on the type of exam you are having performed.  This test typically takes 30-45 minutes to complete.  If you have any questions regarding your exam or if you need to reschedule, you may call the CT department at [phone] between the hours of 8:00 am and 5:00 pm, Monday-Friday.  ________________________________________________________________________ Your provider has requested that you go to the basement level for lab work before leaving today. Press "B" on the elevator. The lab is located at the first door on the left as you exit the elevator.  Thank you for choosing me and [facility]

## 2019-09-26 NOTE — Progress Notes (Signed)
Agree with assessment and plan. Anderson Malta, please check on the patient tomorrow.  Thanks

## 2019-09-26 NOTE — Progress Notes (Signed)
Chief Complaint: Hematochezia, abdominal pain, nausea  HPI:    [name] is a 68 year old female with a past medical history as listed below, known to Dr. Marina GoodellPerry, who presents to clinic today as an urgent work in with an acute complaint of hematochezia, abdominal pain, nausea.    04/01/2019 colonoscopy with Dr. Marina GoodellPerry for a family history of colorectal cancer in her father at the age of 67+.  Colon was noted to be tortuous but the entire examined colon was normal.  Repeat recommended in 5 years for surveillance.    09/26/2019 patient called and described 2 days ago she broke out in a cold sweat and was nauseated, vomited 1 time and then passed out.  She then had a bowel movement and passed quite a bit of bright red blood.  She then had diarrhea for a day and today has been having some gas but still passing some bright red blood.    Today, the patient explains that 2 days ago around 2 PM she became really dizzy and felt like she was going to pass out, she went to lay down and broke out in a very "wet sweat everywhere", she then became nauseous and developed a severe generalized abdominal cramp, rated as a 9/10, and felt like she had to rush to the bathroom, she did not vomit but then sat on the toilet and had a large amount of bright red blood pass out with very minimal liquid stool.  She passed out and woke up on the bathroom floor.  Her bleeding continued through the night and yesterday with at least 6-7 bright red bloody movements with minimal stool.  Today, she passed a few coagulated jelly like clots as well as some more fresh blood about 4-5 times today.  This was about a tablespoon each time.  Has continued with some generalized abdominal discomfort which just feels like a "aching all over".  Has been only doing liquid diet.  Denies any continued fever or chills.  Denies dizziness or further syncopal episodes.    Patient is a retired Engineer, civil (consulting)nurse.    Denies fever, chills, vomiting or symptoms that awaken her  from sleep.  Past Medical History:  Diagnosis Date  . Asthma    no inhalers  . Barrett esophagus   . Bipolar disorder (HCC)   . Constipation    chronic  . Depression   . Endometriosis   . Fibromyalgia   . GERD (gastroesophageal reflux disease)    Small area of Barretts esophagus and hiatal hernia  . Hiatal hernia   . Hyperlipidemia   . Hypertension   . Irritable bowel syndrome   . NSVT (nonsustained ventricular tachycardia) (HCC)   . OA (osteoarthritis)   . PVC's (premature ventricular contractions)    over 10 years  . Restless leg syndrome   . Syncope    in the past    Past Surgical History:  Procedure Laterality Date  . ABDOMINOPLASTY    . APPENDECTOMY    . COLONOSCOPY    . LAPAROSCOPIC TOTAL HYSTERECTOMY    . UPPER GASTROINTESTINAL ENDOSCOPY      Current Outpatient Medications  Medication Sig Dispense Refill  . aspirin EC 325 MG tablet Take 325 mg by mouth daily.    . baclofen (LIORESAL) 10 MG tablet Take 10 mg by mouth 2 (two) times daily.    . Calcium Carbonate-Vit D-Min (CALCIUM 1200 PO) Take by mouth daily.    Marland Kitchen. estradiol (ESTRACE) 1 MG tablet Take 1 mg  by mouth daily.     Marland Kitchen gabapentin (NEURONTIN) 800 MG tablet Take 800 mg by mouth 2 (two) times daily.    . hydrochlorothiazide (HYDRODIURIL) 25 MG tablet Take 25 mg by mouth daily.    Marland Kitchen lamoTRIgine (LAMICTAL) 200 MG tablet Take 200 mg by mouth daily.    Marland Kitchen lisinopril (PRINIVIL,ZESTRIL) 20 MG tablet Take 20 mg by mouth daily.    . meloxicam (MOBIC) 15 MG tablet Take 15 mg by mouth 2 (two) times daily.    . methocarbamol (ROBAXIN) 500 MG tablet Take 500 mg by mouth as needed.     . Multiple Vitamin (MULTIVITAMIN) tablet Take 1 tablet by mouth daily.    . simvastatin (ZOCOR) 20 MG tablet Take 10 mg by mouth daily.      Current Facility-Administered Medications  Medication Dose Route Frequency Provider Last Rate Last Dose  . 0.9 %  sodium chloride infusion  500 mL Intravenous Once Irene Shipper, MD         Allergies as of 09/26/2019 - Review Complete 04/01/2019  Allergen Reaction Noted  . Bextra [valdecoxib]  04/10/2013    Family History  Problem Relation Age of Onset  . Heart disease Mother   . Heart attack Mother 9       X 2  . Pneumonia Mother   . Heart attack Father 20  . Colon cancer Father   . Diabetes Father   . Breast cancer Cousin        x 2  . Colon cancer Paternal Aunt        X 3  . Diabetes Paternal Aunt   . Colon cancer Cousin        x 3  . Diabetes Paternal Aunt        X 3  . Colon cancer Paternal Aunt   . Lymphoma Brother   . Stroke Brother   . Crohn's disease Cousin   . Colon cancer Paternal Aunt   . Diabetes Paternal Aunt     Social History   Socioeconomic History  . Marital status: Married    Spouse name: Not on file  . Number of children: 0  . Years of education: Not on file  . Highest education level: Not on file  Occupational History  . Occupation: retired  Scientific laboratory technician  . Financial resource strain: Not on file  . Food insecurity    Worry: Not on file    Inability: Not on file  . Transportation needs    Medical: Not on file    Non-medical: Not on file  Tobacco Use  . Smoking status: Never Smoker  . Smokeless tobacco: Never Used  Substance and Sexual Activity  . Alcohol use: No  . Drug use: No  . Sexual activity: Not on file  Lifestyle  . Physical activity    Days per week: Not on file    Minutes per session: Not on file  . Stress: Not on file  Relationships  . Social Herbalist on phone: Not on file    Gets together: Not on file    Attends religious service: Not on file    Active member of club or organization: Not on file    Attends meetings of clubs or organizations: Not on file    Relationship status: Not on file  . Intimate partner violence    Fear of current or ex partner: Not on file    Emotionally abused: Not on file  Physically abused: Not on file    Forced sexual activity: Not on file  Other Topics  Concern  . Not on file  Social History Narrative  . Not on file    Review of Systems:    Constitutional: No weight loss, fever or chills Cardiovascular: No chest pain Respiratory: No SOB  Gastrointestinal: See HPI and otherwise negative   Physical Exam:  Vital signs: BP 122/70 (BP Location: Left Arm, Patient Position: Sitting)   Pulse 86   Temp 98.2 F (36.8 C)   Ht 5\' 3"  (1.6 m)   Wt 120 lb 6.4 oz (54.6 kg)   SpO2 96%   BMI 21.33 kg/m   Constitutional:   Pleasant Caucasian female appears to be in NAD, Well developed, Well nourished, alert and cooperative Head:  Normocephalic and atraumatic. Eyes:   PEERL, EOMI. No icterus. Conjunctiva pink. Ears:  Normal auditory acuity. Neck:  Supple Throat: Oral cavity and pharynx without inflammation, swelling or lesion.  Respiratory: Respirations even and unlabored. Lungs clear to auscultation bilaterally.   No wheezes, crackles, or rhonchi.  Cardiovascular: Normal S1, S2. No MRG. Regular rate and rhythm. No peripheral edema, cyanosis or pallor.  Gastrointestinal:  Soft, nondistended, mild generalized ttp. No rebound or guarding. Normal bowel sounds. No appreciable masses or hepatomegaly. Rectal:  Not performed.  Msk:  Symmetrical without gross deformities. Without edema, no deformity or joint abnormality.  Neurologic:  Alert and  oriented x4;  grossly normal neurologically.  Skin:   Dry and intact without significant lesions or rashes. Psychiatric:  Demonstrates good judgement and reason without abnormal affect or behaviors.  Assessment: 1.  Hematochezia: For the past 48 hours, seems to be slowing with only 4-5 movements of about a tablespoon of bright red blood today; most likely ischemic colitis 2.  Generalized abdominal cramping: Worse initially which then resolved with hematochezia; sounds most like ischemic colitis 3.  Nausea: Some better now, worse with initial cramping  Plan: 1.  Discussed symptoms with the patient.  It sounds  like ischemic colitis is most likely.  Patient would like to avoid going into the hospital.  It does sound as though bleeding is slowing over the past 24 hours. 2.  Ordered stat CT abdomen pelvis with contrast 3.  Ordered stat CBC and CMP. 4.  Continue clear liquid/minimal diet 5.  Discussed ER precautions with the patient.  If bleeding increases again or abdominal pain worsens or patient feels dizzy or presyncopal she needs to proceed to the ER.  She verbalized understanding. 6.  Discontinue aspirin for now 7.  Patient to follow in clinic per recommendations after labs and imaging above.  , PA-C [facility] 09/26/2019, 1:56 PM  Cc: 14/12/2018., MD

## 2019-09-26 NOTE — Telephone Encounter (Signed)
Pt states Tuesday she broke out in a cold sweat and was nauseated. Reports she vomited 1 time and then passed out. Reports she then had a BM and passed quite a bit of BRB. Yesterday she had mainly liquids, today she is having some gas and still passing some blood. Pt scheduled to see Ellouise Newer PA today at 2pm, pt aware of appt.

## 2019-09-27 ENCOUNTER — Ambulatory Visit (HOSPITAL_COMMUNITY)
Admission: RE | Admit: 2019-09-27 | Discharge: 2019-09-27 | Disposition: A | Discharge status: Home / Self Care | Payer: Medicare Other | Source: Ambulatory Visit | Attending: Physician Assistant | Admitting: Physician Assistant

## 2019-09-27 ENCOUNTER — Other Ambulatory Visit: Payer: Self-pay

## 2019-09-27 DIAGNOSIS — K921 Melena: Secondary | ICD-10-CM | POA: Diagnosis present

## 2019-09-27 DIAGNOSIS — R1084 Generalized abdominal pain: Secondary | ICD-10-CM | POA: Diagnosis not present

## 2019-09-27 DIAGNOSIS — R110 Nausea: Secondary | ICD-10-CM | POA: Diagnosis present

## 2019-09-27 MED ORDER — IOHEXOL 300 MG/ML  SOLN
100.0000 mL | Freq: Once | INTRAMUSCULAR | Status: AC | PRN
  Administered 2019-09-27: 100 mL via INTRAVENOUS

## 2019-09-27 MED ORDER — SODIUM CHLORIDE (PF) 0.9 % IJ SOLN
INTRAMUSCULAR | Status: AC
  Filled 2019-09-27: qty 50

## 2019-11-12 ENCOUNTER — Telehealth: Payer: Self-pay | Admitting: Physician Assistant

## 2019-11-12 NOTE — Telephone Encounter (Signed)
Pt states that she has been experiencing rectal bleeding since yesterday around 4:30pm and it has continued until this morning. Pt states that she feels very weak and has fainted a couple of times. She is requesting to be seen today. Pls call her.

## 2019-11-12 NOTE — Telephone Encounter (Signed)
Agree with ER evaluation.  She may have ischemic colitis.  Unremarkable colonoscopy in June 2020.

## 2019-11-12 NOTE — Telephone Encounter (Signed)
Called patient back, and she has had a lot of bright red bleeding rectally since 4:30pm yesterday. Said she is also having sever cramping and has passed out several times and feels very weak. Her husband is with her. Told her to go to the ED right away.

## 2019-11-14 ENCOUNTER — Other Ambulatory Visit (INDEPENDENT_AMBULATORY_CARE_PROVIDER_SITE_OTHER): Payer: Medicare Other

## 2019-11-14 ENCOUNTER — Telehealth: Payer: Self-pay

## 2019-11-14 ENCOUNTER — Ambulatory Visit (INDEPENDENT_AMBULATORY_CARE_PROVIDER_SITE_OTHER): Payer: Medicare Other | Admitting: Physician Assistant

## 2019-11-14 ENCOUNTER — Encounter: Payer: Self-pay | Admitting: Physician Assistant

## 2019-11-14 VITALS — BP 120/60 | HR 92 | Temp 97.6°F | Ht 63.0 in | Wt 118.6 lb

## 2019-11-14 DIAGNOSIS — R1084 Generalized abdominal pain: Secondary | ICD-10-CM | POA: Diagnosis not present

## 2019-11-14 DIAGNOSIS — K921 Melena: Secondary | ICD-10-CM | POA: Diagnosis not present

## 2019-11-14 DIAGNOSIS — K559 Vascular disorder of intestine, unspecified: Secondary | ICD-10-CM

## 2019-11-14 DIAGNOSIS — R112 Nausea with vomiting, unspecified: Secondary | ICD-10-CM | POA: Diagnosis not present

## 2019-11-14 LAB — CBC WITH DIFFERENTIAL/PLATELET
Basophils Absolute: 0 10*3/uL (ref 0.0–0.1)
Basophils Relative: 0.4 % (ref 0.0–3.0)
Eosinophils Absolute: 0 10*3/uL (ref 0.0–0.7)
Eosinophils Relative: 0.1 % (ref 0.0–5.0)
HCT: 39.5 % (ref 36.0–46.0)
Hemoglobin: 13.3 g/dL (ref 12.0–15.0)
Lymphocytes Relative: 10.8 % — ABNORMAL LOW (ref 12.0–46.0)
Lymphs Abs: 1.2 10*3/uL (ref 0.7–4.0)
MCHC: 33.7 g/dL (ref 30.0–36.0)
MCV: 90.4 fl (ref 78.0–100.0)
Monocytes Absolute: 0.8 10*3/uL (ref 0.1–1.0)
Monocytes Relative: 7.1 % (ref 3.0–12.0)
Neutro Abs: 8.7 10*3/uL — ABNORMAL HIGH (ref 1.4–7.7)
Neutrophils Relative %: 81.6 % — ABNORMAL HIGH (ref 43.0–77.0)
Platelets: 326 10*3/uL (ref 150.0–400.0)
RBC: 4.36 Mil/uL (ref 3.87–5.11)
RDW: 12.6 % (ref 11.5–15.5)
WBC: 10.7 10*3/uL — ABNORMAL HIGH (ref 4.0–10.5)

## 2019-11-14 LAB — COMPREHENSIVE METABOLIC PANEL WITH GFR
ALT: 13 U/L (ref 0–35)
AST: 18 U/L (ref 0–37)
Albumin: 4.6 g/dL (ref 3.5–5.2)
Alkaline Phosphatase: 65 U/L (ref 39–117)
BUN: 13 mg/dL (ref 6–23)
CO2: 32 meq/L (ref 19–32)
Calcium: 10.2 mg/dL (ref 8.4–10.5)
Chloride: 94 meq/L — ABNORMAL LOW (ref 96–112)
Creatinine, Ser: 0.85 mg/dL (ref 0.40–1.20)
GFR: 66.56 mL/min
Glucose, Bld: 98 mg/dL (ref 70–99)
Potassium: 4.6 meq/L (ref 3.5–5.1)
Sodium: 133 meq/L — ABNORMAL LOW (ref 135–145)
Total Bilirubin: 0.5 mg/dL (ref 0.2–1.2)
Total Protein: 7.3 g/dL (ref 6.0–8.3)

## 2019-11-14 NOTE — Progress Notes (Signed)
Case reviewed personally with JLL, PAC

## 2019-11-14 NOTE — Telephone Encounter (Signed)
Left message for patient to please call back. 

## 2019-11-14 NOTE — Patient Instructions (Addendum)
If you are age 68 or older, your body mass index should be between 23-30. Your Body mass index is 21.01 kg/m. If this is out of the aforementioned range listed, please consider follow up with your Primary Care Provider.  If you are age 50 or younger, your body mass index should be between 19-25. Your Body mass index is 21.01 kg/m. If this is out of the aformentioned range listed, please consider follow up with your Primary Care Provider.   Your provider has requested that you go to the basement level for lab work before leaving today. Press "B" on the elevator. The lab is located at the first door on the left as you exit the elevator.  Stop HCTZ.  You have been scheduled for a CT Angio scan of the abdomen and pelvis at [facility] [address]. You are scheduled on Friday 11/15/19 at 2 pm. You should arrive 15 minutes prior to your appointment time for registration. Nothing to eat 4 hours prior to scan.

## 2019-11-14 NOTE — Progress Notes (Signed)
Chief Complaint: Follow-up hematochezia, abdominal cramping and emesis  HPI:    Jill Howe is a 69 year old female with a past medical history as listed below, known to Dr. Marina Goodell, who returns to clinic today for follow-up of her hematochezia, abdominal cramping and emesis.    09/26/2019 patient seen in clinic for hematochezia, abdominal pain and nausea.  At that time described that 2 days prior she had become really dizzy and fell like she was going to pass out and broke out in a sweat became nauseous and developed severe abdominal cramping rated as a 9/10 and rush to the bathroom passed a large amount of bright red blood.  She then passed out and woke up on the bathroom floor, her bleeding continued through the night and the day before her visit with 6-7 bright red bloody movements with minimal stool.  The day she saw me she passed a few coagulated jelly like clots and more fresh blood about 4-5 times.  Describes aching all over and feeling tired.  She is a retired Engineer, civil (consulting).  At that time patient did not want to go to the hospital.  Ordered a stat CT abdomen pelvis as well as CBC and CMP, discussed possibility this is likely ischemic colitis.     09/26/2019 CBC with a Draeger count of 14.2, hemoglobin 13.  CMP with a sodium low 132 and chloride low at 91 and otherwise normal.    09/27/2019 CT abdomen pelvis with contrast showed areas of mural thickening throughout the transverse colon with surrounding inflammatory changes and hypervascularity in the associated transverse mesocolon, concerning for focal area of colitis, small low-attenuation lesions in the pancreas too small to characterize but favored to represent tiny benign lesion such as pancreatic pseudocysts-follow-up abdominal MRI with and without IV gadolinium with MRCP or pancreatic protocol CT scan is recommended in 2 years and aortic atherosclerosis.  At that time Dr. Marina Goodell discussed either ischemic colitis or infectious colitis with diarrhea and she  should have supportive care.    11/12/2019 patient called and said she had a lot of bright red bleeding rectally since 430 the day before with severe cramping it passed out several times and felt very weak.  She was told to go to the ER.    Today, the patient presents to clinic and explains that after about 4 days from her last episode everything resolved including her abdominal pain and hematochezia.  Then on the afternoon of 11/11/2018 2 days ago she was walking her dogs in the woods and all of a sudden felt cold and sweaty and dizzy and sat down and "apparently passed out".  She then developed generalized abdominal cramping which was severe, rated as a 10/10 and felt like she was still going to pass out, she got up and walked a few more feet and passed out again.  When she woke up she had vomited on herself and also urinated on herself.  She called her husband who finally found her in the woods and when she got home she started with a massive amount of bright red blood from her rectum.  Tells me that this is very forceful and has very little stool mixed in.  This lasted intensely for about 24 hours and then started to slow.  Tells me now that she continues to bleed at least 8-10 times a day but it is becoming more congealed at this point.  Remains with abdominal pain which is mostly on the left side of her abdomen now,  but still pretty severe.  Associated symptoms include dizziness and fatigue as well as weakness.    Denies herbal supplements or smoking.  Past Medical History:  Diagnosis Date  . Asthma    no inhalers  . Barrett esophagus   . Bipolar disorder (HCC)   . Constipation    chronic  . Depression   . Endometriosis   . Fibromyalgia   . GERD (gastroesophageal reflux disease)    Small area of Barretts esophagus and hiatal hernia  . Hiatal hernia   . Hyperlipidemia   . Hypertension   . Irritable bowel syndrome   . NSVT (nonsustained ventricular tachycardia) (HCC)   . OA (osteoarthritis)    . PVC's (premature ventricular contractions)    over 10 years  . Restless leg syndrome   . Syncope    in the past    Past Surgical History:  Procedure Laterality Date  . ABDOMINOPLASTY    . APPENDECTOMY    . COLONOSCOPY    . LAPAROSCOPIC TOTAL HYSTERECTOMY    . UPPER GASTROINTESTINAL ENDOSCOPY      Current Outpatient Medications  Medication Sig Dispense Refill  . aspirin EC 325 MG tablet Take 325 mg by mouth daily.    . baclofen (LIORESAL) 10 MG tablet Take 10 mg by mouth 2 (two) times daily.    . Calcium Carbonate-Vit D-Min (CALCIUM 1200 PO) Take by mouth daily.    Marland Kitchen gabapentin (NEURONTIN) 800 MG tablet Take 800 mg by mouth 2 (two) times daily.    . hydrochlorothiazide (HYDRODIURIL) 25 MG tablet Take 25 mg by mouth daily.    Marland Kitchen lamoTRIgine (LAMICTAL) 200 MG tablet Take 150 mg by mouth daily.     Marland Kitchen lisinopril (PRINIVIL,ZESTRIL) 20 MG tablet Take 20 mg by mouth daily.    . meloxicam (MOBIC) 15 MG tablet Take 15 mg by mouth 2 (two) times daily.    . methocarbamol (ROBAXIN) 500 MG tablet Take 500 mg by mouth as needed.     . Multiple Vitamin (MULTIVITAMIN) tablet Take 1 tablet by mouth daily.    . simvastatin (ZOCOR) 20 MG tablet Take 10 mg by mouth daily.      Current Facility-Administered Medications  Medication Dose Route Frequency Provider Last Rate Last Admin  . 0.9 %  sodium chloride infusion  500 mL Intravenous Once Hilarie Fredrickson, MD        Allergies as of 11/14/2019 - Review Complete 11/14/2019  Allergen Reaction Noted  . Bextra [valdecoxib]  04/10/2013    Family History  Problem Relation Age of Onset  . Heart disease Mother   . Heart attack Mother 59       X 2  . Pneumonia Mother   . Heart attack Father 71  . Colon cancer Father   . Diabetes Father   . Breast cancer Cousin        x 2  . Colon cancer Paternal Aunt        X 3  . Diabetes Paternal Aunt   . Colon cancer Cousin        x 3  . Diabetes Paternal Aunt        X 3  . Colon cancer Paternal Aunt     . Lymphoma Brother   . Stroke Brother   . Crohn's disease Cousin   . Colon cancer Paternal Aunt   . Diabetes Paternal Aunt     Social History   Socioeconomic History  . Marital status: Married    Spouse  name: Not on file  . Number of children: 0  . Years of education: Not on file  . Highest education level: Not on file  Occupational History  . Occupation: retired  Tobacco Use  . Smoking status: Never Smoker  . Smokeless tobacco: Never Used  Substance and Sexual Activity  . Alcohol use: No  . Drug use: No  . Sexual activity: Not on file  Other Topics Concern  . Not on file  Social History Narrative  . Not on file   Social Determinants of Health   Financial Resource Strain:   . Difficulty of Paying Living Expenses: Not on file  Food Insecurity:   . Worried About Programme researcher, broadcasting/film/video in the Last Year: Not on file  . Ran Out of Food in the Last Year: Not on file  Transportation Needs:   . Lack of Transportation (Medical): Not on file  . Lack of Transportation (Non-Medical): Not on file  Physical Activity:   . Days of Exercise per Week: Not on file  . Minutes of Exercise per Session: Not on file  Stress:   . Feeling of Stress : Not on file  Social Connections:   . Frequency of Communication with Friends and Family: Not on file  . Frequency of Social Gatherings with Friends and Family: Not on file  . Attends Religious Services: Not on file  . Active Member of Clubs or Organizations: Not on file  . Attends Banker Meetings: Not on file  . Marital Status: Not on file  Intimate Partner Violence:   . Fear of Current or Ex-Partner: Not on file  . Emotionally Abused: Not on file  . Physically Abused: Not on file  . Sexually Abused: Not on file    Review of Systems:    Constitutional: No weight loss, fever or chills Cardiovascular: No chest pain Respiratory: No SOB  Gastrointestinal: See HPI and otherwise negative   Physical Exam:  Vital signs: BP  120/60   Pulse 92   Temp 97.6 F (36.4 C)   Ht 5\' 3"  (1.6 m)   Wt 118 lb 9.6 oz (53.8 kg)   BMI 21.01 kg/m   Constitutional:   Pleasant weak appearing Caucasian female appears to be in NAD, Well developed, Well nourished, alert and cooperative Respiratory: Respirations even and unlabored. Lungs clear to auscultation bilaterally.   No wheezes, crackles, or rhonchi.  Cardiovascular: Normal S1, S2. No MRG. Regular rate and rhythm. No peripheral edema, cyanosis or pallor.  Gastrointestinal:  Soft, nondistended, Marked ttp on the left side of the abdomen with involuntary guarding, Normal bowel sounds. No appreciable masses or hepatomegaly. Rectal:  Not performed.  Psychiatric: Demonstrates good judgement and reason without abnormal affect or behaviors.  RELEVANT LABS AND IMAGING: CBC    Component Value Date/Time   WBC 14.2 (H) 09/26/2019 1434   RBC 4.30 09/26/2019 1434   HGB 13.0 09/26/2019 1434   HCT 38.5 09/26/2019 1434   PLT 332.0 09/26/2019 1434   MCV 89.5 09/26/2019 1434   MCHC 33.7 09/26/2019 1434   RDW 12.2 09/26/2019 1434   LYMPHSABS 1.2 09/26/2019 1434   MONOABS 0.9 09/26/2019 1434   EOSABS 0.0 09/26/2019 1434   BASOSABS 0.0 09/26/2019 1434    CMP     Component Value Date/Time   NA 132 (L) 09/26/2019 1434   K 4.5 09/26/2019 1434   CL 91 (L) 09/26/2019 1434   CO2 27 09/26/2019 1434   GLUCOSE 89 09/26/2019 1434  BUN 22 09/26/2019 1434   CREATININE 1.06 09/26/2019 1434   CALCIUM 10.0 09/26/2019 1434   PROT 7.2 09/26/2019 1434   ALBUMIN 4.6 09/26/2019 1434   AST 24 09/26/2019 1434   ALT 22 09/26/2019 1434   ALKPHOS 62 09/26/2019 1434   BILITOT 0.7 09/26/2019 1434    Assessment: 1.  Hematochezia: Again likely all of the symptoms are from ischemic colitis, though recurrent at this point, discussed case with Dr. Henrene Pastor 2.  Generalized abdominal pain 3.  Nausea and vomiting 4.  Dizziness  Plan: 1.  Discussed case with Dr. Henrene Pastor at the time of patient's  appointment. 2.  Ordered a CTA of the abdomen and pelvis. 3.  Ordered repeat labs including CBC and CMP. 4.  Encouraged the patient to stay as well hydrated as she can. 5.  Discontinued the patient's HCTZ, explained that this can dehydrate and make her prone to having more of these episodes. 6.  Patient to follow in clinic per recommendations after labs and imaging above.  Patient was given ER precautions.  Ellouise Newer, PA-C [facility] 11/14/2019, 11:36 AM  Cc: Raina Mina., MD

## 2019-11-15 ENCOUNTER — Ambulatory Visit
Admission: RE | Admit: 2019-11-15 | Discharge: 2019-11-15 | Disposition: A | Discharge status: Home / Self Care | Payer: Medicare Other | Source: Ambulatory Visit | Attending: Physician Assistant | Admitting: Physician Assistant

## 2019-11-15 DIAGNOSIS — K559 Vascular disorder of intestine, unspecified: Secondary | ICD-10-CM

## 2019-11-15 MED ORDER — IOPAMIDOL (ISOVUE-370) INJECTION 76%
75.0000 mL | Freq: Once | INTRAVENOUS | Status: AC | PRN
  Administered 2019-11-15: 75 mL via INTRAVENOUS

## 2019-11-21 ENCOUNTER — Ambulatory Visit: Payer: Medicare Other | Admitting: Nurse Practitioner

## 2019-11-25 ENCOUNTER — Ambulatory Visit: Payer: Medicare Other | Admitting: Internal Medicine

## 2019-11-25 ENCOUNTER — Ambulatory Visit (HOSPITAL_COMMUNITY): Payer: Medicare Other

## 2019-12-13 ENCOUNTER — Ambulatory Visit (INDEPENDENT_AMBULATORY_CARE_PROVIDER_SITE_OTHER): Payer: Medicare Other | Admitting: Internal Medicine

## 2019-12-13 ENCOUNTER — Encounter: Payer: Self-pay | Admitting: Internal Medicine

## 2019-12-13 VITALS — BP 150/70 | HR 82 | Temp 98.5°F | Ht 63.0 in | Wt 116.0 lb

## 2019-12-13 DIAGNOSIS — R935 Abnormal findings on diagnostic imaging of other abdominal regions, including retroperitoneum: Secondary | ICD-10-CM | POA: Diagnosis not present

## 2019-12-13 DIAGNOSIS — R55 Syncope and collapse: Secondary | ICD-10-CM

## 2019-12-13 DIAGNOSIS — K625 Hemorrhage of anus and rectum: Secondary | ICD-10-CM

## 2019-12-13 DIAGNOSIS — R109 Unspecified abdominal pain: Secondary | ICD-10-CM | POA: Diagnosis not present

## 2019-12-13 DIAGNOSIS — Z01818 Encounter for other preprocedural examination: Secondary | ICD-10-CM

## 2019-12-13 MED ORDER — NA SULFATE-K SULFATE-MG SULF 17.5-3.13-1.6 GM/177ML PO SOLN: 1.0000 | Freq: Once | ORAL | 0 refills | Status: AC

## 2019-12-13 NOTE — Progress Notes (Signed)
HISTORY OF PRESENT ILLNESS:  Jill Howe is a 69 y.o. 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  She is followed in this office principally for a family history of colon polyps and surveillance colonoscopy.  Previous examinations 1994, 2003, 2005, 2011, 2014, and June 2020.  All negative for neoplasia.  She was in her usual state of good health until early December when while sitting at home she developed sweats and lightheadedness.  Nausea and vomiting.  NO abdominal pain.  Subsequently had a syncopal episode.  Was dizzy and had difficulty rising to her feet.  Thereafter she developed left-sided abdominal pain followed by bloody bowel movements.  She was evaluated and had a CT scan which showed segmental transverse colitis.  Subsequently seen here and felt to have had acute ischemic colitis.  Was treated conservatively and recovered.  However, in mid January she experienced a similar problem while walking her dog.  Again the issue began with diaphoresis and dizziness followed by syncope.  Awaking thereafter with abdominal pain and subsequent rectal bleeding.  Seen in our office November 14, 2019.  CT angiogram the following day revealed transverse segmental colitis.  The mesenteric vasculature was patent.  She was advised to stop her diuretic.  She presents today for follow-up.  Blood work September 26, 2019 revealed BUN 22 creatinine 1.06, hemoglobin 13.0, [place] blood cell count 14.2.  Blood work November 13, 2018 revealed BUN of 13, creatinine 0.85, [place] blood cell count 10.7, hemoglobin 13.3.  Patient tells me that within a week after her last episode she was feeling well.  Currently with no abdominal pain.  Bowel habits are back to baseline.  No further bleeding.  She is very disturbed about the syncopal episodes and there etiology.  Since holding her hydrochlorothiazide her systolic blood pressures have  slightly elevated.  Typically in the 140-150 range.  Diastolics remain quite good at 70  REVIEW OF SYSTEMS:  All non-GI ROS negative unless otherwise stated in the HPI except for arthritis, back pain, headaches, night sweats, ankle edema  Past Medical History:  Diagnosis Date  . Asthma    no inhalers  . Barrett esophagus   . Bipolar disorder (HCC)   . Constipation    chronic  . Depression   . Endometriosis   . Fibromyalgia   . GERD (gastroesophageal reflux disease)    Small area of Barretts esophagus and hiatal hernia  . Hiatal hernia   . Hyperlipidemia   . Hypertension   . Irritable bowel syndrome   . NSVT (nonsustained ventricular tachycardia) (HCC)   . OA (osteoarthritis)   . PVC's (premature ventricular contractions)    over 10 years  . Restless leg syndrome   . Syncope    in the past    Past Surgical History:  Procedure Laterality Date  . ABDOMINOPLASTY    . APPENDECTOMY    . COLONOSCOPY    . LAPAROSCOPIC TOTAL HYSTERECTOMY    . UPPER GASTROINTESTINAL ENDOSCOPY    . wrist cystectomy Right 2020    Social History Jill Howe  reports that she has never smoked. She has never used smokeless tobacco. She reports that she does not drink alcohol or use drugs.  family history includes Breast cancer in her cousin; Colon cancer in her cousin, father, paternal aunt, paternal aunt, and paternal aunt; Crohn's disease in her cousin; Diabetes in her father, paternal aunt, and paternal aunt; Heart  attack (age of onset: 50) in her father; Heart attack (age of onset: 61) in her mother; Heart disease in her mother; Lymphoma in her brother; Pneumonia in her mother; Stroke in her brother.  Allergies  Allergen Reactions  . Bextra [Valdecoxib]     ? rash       PHYSICAL EXAMINATION: Vital signs: BP (!) 150/70   Pulse 82   Temp 98.5 F (36.9 C)   Ht 5\' 3"  (1.6 m)   Wt 116 lb (52.6 kg)   BMI 20.55 kg/m   Constitutional: generally well-appearing, no acute  distress Psychiatric: alert and oriented x3, cooperative Eyes: extraocular movements intact, anicteric, conjunctiva pink Mouth: oral pharynx moist, no lesions Neck: supple no lymphadenopathy Cardiovascular: heart regular rate and rhythm, no murmur Lungs: clear to auscultation bilaterally Abdomen: soft, reports mild tenderness with palpation on the left side, nondistended, no obvious ascites, no peritoneal signs, normal bowel sounds, no organomegaly Rectal: Omitted Extremities: no clubbing, cyanosis, or lower extremity edema bilaterally Skin: no lesions on visible extremities Neuro: No focal deficits.  Cranial nerves intact  ASSESSMENT:  1.  Recurrent vasovagal symptoms followed by syncope with subsequent abdominal pain and bleeding.  Imaging shows segmental colitis on 2 occasions.  Clinically improved.  The segmental colitis is almost certainly ischemic colitis.  I suspect this is small vessel disease with spasm or hypoperfusion given the results of the CT angiogram.  We did stop her diuretic wondering if relative hypovolemia may have contributed.  Interestingly, she is adamant that there were no abdominal complaints that antedated the vasovagal symptom complex and syncope.  Currently doing well but appropriately concerned, she is.   PLAN:  1.  Continue to hold diuretic 2.  Schedule colonoscopy to assess for persistence or resolution of colitis.The nature of the procedure, as well as the risks, benefits, and alternatives were carefully and thoroughly reviewed with the patient. Ample time for discussion and questions allowed. The patient understood, was satisfied, and agreed to proceed. 3.  Schedule electrophysiology evaluation with Dr. Osie Cheeks regarding recurrent syncope that is not clearly elicited by GI symptoms.  In other words, I wonder if vasovagal symptom complex with transient hypotension leads not only to syncope but hypoperfusion of the gut with resultant acute ischemic colitis.   She has seen Dr. Lovena Le in the past.  Look forward to his opinion. A total time of 40 minutes was spent preparing to see the patient reviewing test (laboratories, x-rays, colonoscopy reports), obtaining the history, performing comprehensive medical physical exam, counseling the patient regarding her clinical problems, and documenting clinical information in the health record.

## 2019-12-13 NOTE — Patient Instructions (Signed)
You have been scheduled for a colonoscopy. Please follow written instructions given to you at your visit today.  Please pick up your prep supplies at the pharmacy within the next 1-3 days. If you use inhalers (even only as needed), please bring them with you on the day of your procedure.   

## 2019-12-20 ENCOUNTER — Other Ambulatory Visit: Payer: Self-pay

## 2019-12-20 ENCOUNTER — Ambulatory Visit (INDEPENDENT_AMBULATORY_CARE_PROVIDER_SITE_OTHER): Payer: Medicare Other

## 2019-12-20 ENCOUNTER — Other Ambulatory Visit: Payer: Self-pay | Admitting: Internal Medicine

## 2019-12-20 DIAGNOSIS — Z1159 Encounter for screening for other viral diseases: Secondary | ICD-10-CM

## 2019-12-21 LAB — SARS CORONAVIRUS 2 (TAT 6-24 HRS): SARS Coronavirus 2: NEGATIVE

## 2019-12-24 ENCOUNTER — Other Ambulatory Visit: Payer: Self-pay

## 2019-12-24 ENCOUNTER — Ambulatory Visit (AMBULATORY_SURGERY_CENTER): Payer: Medicare Other | Admitting: Internal Medicine

## 2019-12-24 ENCOUNTER — Encounter: Payer: Self-pay | Admitting: Internal Medicine

## 2019-12-24 VITALS — BP 118/58 | HR 74 | Temp 96.6°F | Resp 13 | Ht 63.0 in | Wt 116.0 lb

## 2019-12-24 DIAGNOSIS — R109 Unspecified abdominal pain: Secondary | ICD-10-CM | POA: Diagnosis not present

## 2019-12-24 DIAGNOSIS — R935 Abnormal findings on diagnostic imaging of other abdominal regions, including retroperitoneum: Secondary | ICD-10-CM

## 2019-12-24 DIAGNOSIS — K625 Hemorrhage of anus and rectum: Secondary | ICD-10-CM | POA: Diagnosis present

## 2019-12-24 MED ORDER — SODIUM CHLORIDE 0.9 % IV SOLN: 500.0000 mL | INTRAVENOUS | Status: DC

## 2019-12-24 NOTE — Op Note (Signed)
[facility] Patient Name: Jill Howe Procedure Date: 12/24/2019 9:51 AM MRN: [id number] Endoscopist: Docia Chuck. Henrene Pastor , MD Age: 69 Referring MD:  Date of Birth: 10-26-1950 Gender: Female Account #: [id number] Procedure:                Colonoscopy Indications:              Abdominal pain, Rectal bleeding, Abnormal CT of the                            GI tract. Clinically felt to have recurrent                            ischemic colitis with segmental colitis involving                            the transverse and descending regions on CT Medicines:                Monitored Anesthesia Care Procedure:                Pre-Anesthesia Assessment:                           - Prior to the procedure, a History and Physical                            was performed, and patient medications and                            allergies were reviewed. The patient's tolerance of                            previous anesthesia was also reviewed. The risks                            and benefits of the procedure and the sedation                            options and risks were discussed with the patient.                            All questions were answered, and informed consent                            was obtained. Prior Anticoagulants: The patient has                            taken no previous anticoagulant or antiplatelet                            agents. ASA Grade Assessment: II - A patient with                            mild systemic disease. After reviewing the risks  and benefits, the patient was deemed in                            satisfactory condition to undergo the procedure.                           After obtaining informed consent, the colonoscope                            was passed under direct vision. Throughout the                            procedure, the patient's blood pressure, pulse, and                            oxygen saturations were  monitored continuously. The                            Colonoscope was introduced through the anus and                            advanced to the the cecum, identified by                            appendiceal orifice and ileocecal valve. The                            ileocecal valve, appendiceal orifice, and rectum                            were photographed. The quality of the bowel                            preparation was excellent. The colonoscopy was                            performed without difficulty. The patient tolerated                            the procedure well. The bowel preparation used was                            SUPREP via split dose instruction. Scope In: 10:02:36 AM Scope Out: 10:16:35 AM Scope Withdrawal Time: 0 hours 8 minutes 54 seconds  Total Procedure Duration: 0 hours 13 minutes 59 seconds  Findings:                 The entire examined colon appeared normal on direct                            and retroflexion views. Incidental hypertrophic                            anal papilla noted. Complications:  No immediate complications. Estimated blood loss:                            None. Estimated Blood Loss:     Estimated blood loss: none. Impression:               - The entire examined colon is normal on direct and                            retroflexion views. Incidental hypertrophic anal                            papilla.                           - No specimens collected. Recommendation:           - Repeat colonoscopy in 5 years for screening                            purposes (family history).                           - Patient has a contact number available for                            emergencies. The signs and symptoms of potential                            delayed complications were discussed with the                            patient. Return to normal activities tomorrow.                            Written discharge  instructions were provided to the                            patient.                           - Resume previous diet.                           - Continue present medications.                           - Keep plans for cardiology opinion regarding                            syncopal spells Shakaya Bhullar N. Marina Goodell, MD 12/24/2019 10:31:15 AM This report has been signed electronically.

## 2019-12-24 NOTE — Patient Instructions (Signed)
YOU HAD AN ENDOSCOPIC PROCEDURE TODAY AT THE [facility]:   Refer to the procedure report that was given to you for any specific questions about what was found during the examination.  If the procedure report does not answer your questions, please call your gastroenterologist to clarify.  If you requested that your care partner not be given the details of your procedure findings, then the procedure report has been included in a sealed envelope for you to review at your convenience later.  YOU SHOULD EXPECT: Some feelings of bloating in the abdomen. Passage of more gas than usual.  Walking can help get rid of the air that was put into your GI tract during the procedure and reduce the bloating. If you had a lower endoscopy (such as a colonoscopy or flexible sigmoidoscopy) you may notice spotting of blood in your stool or on the toilet paper. If you underwent a bowel prep for your procedure, you may not have a normal bowel movement for a few days.  Please Note:  You might notice some irritation and congestion in your nose or some drainage.  This is from the oxygen used during your procedure.  There is no need for concern and it should clear up in a day or so.  SYMPTOMS TO REPORT IMMEDIATELY:   Following lower endoscopy (colonoscopy or flexible sigmoidoscopy):  Excessive amounts of blood in the stool  Significant tenderness or worsening of abdominal pains  Swelling of the abdomen that is new, acute  Fever of 100F or higher   For urgent or emergent issues, a gastroenterologist can be reached at any hour by calling [phone].   DIET:  We do recommend a small meal at first, but then you may proceed to your regular diet.  Drink plenty of fluids but you should avoid alcoholic beverages for 24 hours.  MEDICATIONS: Continue present medications.  Please see handouts given to you by your recovery nurse.  ACTIVITY:  You should plan to take it easy for the rest of today and you should  NOT DRIVE or use heavy machinery until tomorrow (because of the sedation medicines used during the test).    FOLLOW UP: Our staff will call the number listed on your records 48-72 hours following your procedure to check on you and address any questions or concerns that you may have regarding the information given to you following your procedure. If we do not reach you, we will leave a message.  We will attempt to reach you two times.  During this call, we will ask if you have developed any symptoms of COVID 19. If you develop any symptoms (ie: fever, flu-like symptoms, shortness of breath, cough etc.) before then, please call [phone].  If you test positive for Covid 19 in the 2 weeks post procedure, please call and report this information to us.    If any biopsies were taken you will be contacted by phone or by letter within the next 1-3 weeks.  Please call us at [phone] if you have not heard about the biopsies in 3 weeks.   Thank you for allowing us to provide for your healthcare needs today.   SIGNATURES/CONFIDENTIALITY: You and/or your care partner have signed paperwork which will be entered into your electronic medical record.  These signatures attest to the fact that that the information above on your After Visit Summary has been reviewed and is understood.  Full responsibility of the confidentiality of this discharge information lies with you and/or   your care-partner. 

## 2019-12-24 NOTE — Progress Notes (Signed)
Pt's states no medical or surgical changes since previsit or office visit.  DT vitals, PH Iv and JB temp.

## 2019-12-24 NOTE — Progress Notes (Signed)
Report to PACU, RN, vss, BBS= Clear.  

## 2019-12-26 ENCOUNTER — Telehealth: Payer: Self-pay | Admitting: *Deleted

## 2019-12-26 NOTE — Telephone Encounter (Signed)
  Follow up Call-  Call back number 12/24/2019 04/01/2019  Post procedure Call Back phone  # [phone]  Permission to leave phone message Yes Yes  Some recent data might be hidden     Patient questions:  Do you have a fever, pain , or abdominal swelling? No. Pain Score  0 *  Have you tolerated food without any problems? Yes.    Have you been able to return to your normal activities? Yes.    Do you have any questions about your discharge instructions: Diet   No. Medications  No. Follow up visit  No.  Do you have questions or concerns about your Care? No.  Actions: * If pain score is 4 or above: No action needed, pain <4.    1. Have you developed a fever since your procedure? no  2.   Have you had an respiratory symptoms (SOB or cough) since your procedure? no  3.   Have you tested positive for COVID 19 since your procedure no  4.   Have you had any family members/close contacts diagnosed with the COVID 19 since your procedure?  no   If yes to any of these questions please route to Laverna Peace, RN and Jennye Boroughs, Charity fundraiser.

## 2020-01-08 ENCOUNTER — Telehealth: Payer: Self-pay | Admitting: Internal Medicine

## 2020-01-08 NOTE — Telephone Encounter (Signed)
Noted. Almost always have pain with ischemia. Recent colon was normal. Thanks

## 2020-01-08 NOTE — Telephone Encounter (Signed)
FYI pt wanted to make you both aware she had another ischemic colitis episode.  She states that this is the 3 rd episode over a 6 month period.  Recent colonoscopy. She began having rectal bleeding yesterday and states that the bleeding has eased some now.  No abd pain at this time.

## 2020-01-14 NOTE — Progress Notes (Signed)
CARDIOLOGY CONSULT NOTE       Patient ID: Jill Howe MRN: [id number] DOB/AGE: 1951/06/01 69 y.o.  Admit date: (Not on file) Referring Physician: Yancey Flemings Primary Physician: Gordan Payment., MD Primary Cardiologist: New Reason for Consultation: Syncope  Active Problems:   * No active hospital problems. *   HPI:  69 y.o. with history of bipolar, HLD, HTN distant PVCls  She started having spells in December. Would get diaphoretic with nausea and lightheaded with abdominal pain Diagnosed with colitis on two occassions CT 11/15/19 with findings concerning for infectious /inflammatory colitis involving transverse and descending colon WBC has been elevated 14.2 3 months ago and 10.7 2 months ago Hct 39 range She has seen Dr Marina Goodell for this Last colonoscopy done 12/24/19 which was normal with no signs of bleeding or ischemia  During these 3 spells of colitis in last 6 months has had diuretic held She is on statin and ACE .  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 Active at home with no palpitations, chest pain or dyspnea   ROS All other systems reviewed and negative except as noted above  Past Medical History:  Diagnosis Date  . Asthma    no inhalers  . Barrett esophagus   . Bipolar disorder (HCC)   . Constipation    chronic  . Depression   . Endometriosis   . Fibromyalgia   . GERD (gastroesophageal reflux disease)    Small area of Barretts esophagus and hiatal hernia  . Hiatal hernia   . Hyperlipidemia   . Hypertension   . Irritable bowel syndrome   . NSVT (nonsustained ventricular tachycardia) (HCC)   . OA (osteoarthritis)   . PVC's (premature ventricular contractions)    over 10 years  . Restless leg syndrome   . Syncope      in the past    Family History  Problem Relation Age of Onset  . Heart disease Mother   . Heart attack Mother 36       X 2  . Pneumonia Mother   . Heart attack Father 24  . Colon cancer Father   . Diabetes Father   . Breast cancer Cousin        x 2  . Colon cancer Paternal Aunt        X 3  . Colon cancer Cousin        x 3  . Diabetes Paternal Aunt        X 3  . Colon cancer Paternal Aunt   . Lymphoma Brother   . Stroke Brother   . Crohn's disease Cousin   . Colon cancer Paternal Aunt   . Diabetes Paternal Aunt   . Rectal cancer Neg Hx     Social History   Socioeconomic History  . Marital status: Married    Spouse name: Not on file  . Number of children: 0  . Years of education: Not on file  . Highest education level: Not on file  Occupational History  . Occupation: retired  Tobacco Use  . Smoking status: Never Smoker  . Smokeless tobacco: Never Used  Substance and Sexual Activity  . Alcohol use: No  .  Drug use: No  . Sexual activity: Not on file  Other Topics Concern  . Not on file  Social History Narrative  . Not on file   Social Determinants of Health   Financial Resource Strain:   . Difficulty of Paying Living Expenses:   Food Insecurity:   . Worried About Charity fundraiser in the Last Year:   . Arboriculturist in the Last Year:   Transportation Needs:   . Film/video editor (Medical):   Marland Kitchen Lack of Transportation (Non-Medical):   Physical Activity:   . Days of Exercise per Week:   . Minutes of Exercise per Session:   Stress:   . Feeling of Stress :   Social Connections:   . Frequency of Communication with Friends and Family:   . Frequency of Social Gatherings with Friends and Family:   . Attends Religious Services:   . Active Member of Clubs or Organizations:   . Attends Archivist Meetings:   Marland Kitchen Marital Status:   Intimate Partner Violence:   . Fear of Current or Ex-Partner:   . Emotionally Abused:   Marland Kitchen Physically Abused:    . Sexually Abused:     Past Surgical History:  Procedure Laterality Date  . ABDOMINOPLASTY    . APPENDECTOMY    . COLONOSCOPY    . LAPAROSCOPIC TOTAL HYSTERECTOMY    . UPPER GASTROINTESTINAL ENDOSCOPY    . wrist cystectomy Right 2020      Current Outpatient Medications:  .  aspirin EC 325 MG tablet, Take 325 mg by mouth daily., Disp: , Rfl:  .  baclofen (LIORESAL) 10 MG tablet, Take 10 mg by mouth 2 (two) times daily., Disp: , Rfl:  .  Calcium Carbonate-Vit D-Min (CALCIUM 1200 PO), Take by mouth daily., Disp: , Rfl:  .  hydrochlorothiazide (HYDRODIURIL) 25 MG tablet, Take 25 mg by mouth daily., Disp: , Rfl:  .  lamoTRIgine (LAMICTAL) 200 MG tablet, Take 150 mg by mouth daily. , Disp: , Rfl:  .  lisinopril (PRINIVIL,ZESTRIL) 20 MG tablet, Take 20 mg by mouth daily., Disp: , Rfl:  .  Multiple Vitamin (MULTIVITAMIN) tablet, Take 1 tablet by mouth daily., Disp: , Rfl:  .  simvastatin (ZOCOR) 20 MG tablet, Take 10 mg by mouth daily. , Disp: , Rfl:   Current Facility-Administered Medications:  .  0.9 %  sodium chloride infusion, 500 mL, Intravenous, Continuous, Irene Shipper, MD  . sodium chloride      Physical Exam: Blood pressure 114/62, pulse 73, height 5\' 3"  (1.6 m), weight 127 lb (57.6 kg), SpO2 98 %.    Affect appropriate Healthy:  appears stated age 47: normal Neck supple with no adenopathy JVP normal no bruits no thyromegaly Lungs clear with no wheezing and good diaphragmatic motion Heart:  S1/S2 no murmur, no rub, gallop or click PMI normal Abdomen: benighn, BS positve, no tenderness, no AAA no bruit.  No HSM or HJR Distal pulses intact with no bruits No edema Neuro non-focal Skin warm and dry No muscular weakness   Labs:   Lab Results  Component Value Date   WBC 10.7 (H) 11/14/2019   HGB 13.3 11/14/2019   HCT 39.5 11/14/2019   MCV 90.4 11/14/2019   PLT 326.0 11/14/2019     Radiology: No results found.  EKG: SR rate 73 normal    ASSESSMENT AND  PLAN:   1. Pre syncope: likely vagal reaction related to colitis. ECG is normal with no signs  of conduction disease will order echo to r/o structural heart disease. Will order ETT to make sure HR/BP response to exercise/stress is normal. She has a bone spur on her heel and ETT will need to be done in a few weeks as she is getting a cortisone shot next week 2. Colitis:  ? Occult micro-perforations Recent colon and CT not revealing f/u with Dr Marina Goodell GI  3. HTN:  Well controlled.  Continue current medications and low sodium Dash type diet.   4. HLD:  On statin labs with primary   Signed: Charlton Haws 01/17/2020, 4:54 PM

## 2020-01-17 ENCOUNTER — Other Ambulatory Visit: Payer: Self-pay

## 2020-01-17 ENCOUNTER — Encounter: Payer: Self-pay | Admitting: Cardiovascular Disease

## 2020-01-17 ENCOUNTER — Ambulatory Visit (INDEPENDENT_AMBULATORY_CARE_PROVIDER_SITE_OTHER): Payer: Medicare Other | Admitting: Cardiovascular Disease

## 2020-01-17 VITALS — BP 114/62 | HR 73 | Ht 63.0 in | Wt 127.0 lb

## 2020-01-17 DIAGNOSIS — R55 Syncope and collapse: Secondary | ICD-10-CM | POA: Diagnosis not present

## 2020-01-17 DIAGNOSIS — R079 Chest pain, unspecified: Secondary | ICD-10-CM | POA: Diagnosis not present

## 2020-01-17 NOTE — Patient Instructions (Signed)
Medication Instructions:  Your physician recommends that you continue on your current medications as directed. Please refer to the Current Medication list given to you today.  *If you need a refill on your cardiac medications before your next appointment, please call your pharmacy*   Lab Work: None ordered  If you have labs (blood work) drawn today and your tests are completely normal, you will receive your results only by: Marland Kitchen MyChart Message (if you have MyChart) OR . A paper copy in the mail If you have any lab test that is abnormal or we need to change your treatment, we will call you to review the results.   Testing/Procedures: Your physician has requested that you have an exercise tolerance test IN 4-6 WEEKS. For further information please visit [email or web address] Please also follow instruction sheet, as given.   Your physician has requested that you have an echocardiogram. Echocardiography is a painless test that uses sound waves to create images of your heart. It provides your doctor with information about the size and shape of your heart and how well your heart's chambers and valves are working. This procedure takes approximately one hour. There are no restrictions for this procedure.     Follow-Up: At [facility], you and your health needs are our priority.  As part of our continuing mission to provide you with exceptional heart care, we have created designated Provider Care Teams.  These Care Teams include your primary Cardiologist (physician) and Advanced Practice Providers (APPs -  Physician Assistants and Nurse Practitioners) who all work together to provide you with the care you need, when you need it.  We recommend signing up for the patient portal called "MyChart".  Sign up information is provided on this After Visit Summary.  MyChart is used to connect with patients for Virtual Visits (Telemedicine).  Patients are able to view lab/test results, encounter notes,  upcoming appointments, etc.  Non-urgent messages can be sent to your provider as well.   To learn more about what you can do with MyChart, go to [email or web address].    Your next appointment:   As needed  Other Instructions  Exercise Stress Test An exercise stress test is a test to check how your heart works during exercise. You will need to walk on a treadmill or ride an exercise bike for this test. An electrocardiogram (ECG) will record your heartbeat when you are at rest and when you are exercising. You may have an ultrasound or nuclear test after the exercise test. The test is done to check for coronary artery disease (CAD). It is also done to:  See how well you can exercise.  Watch for high blood pressure during exercise.  Test how well you can exercise after treatment.  Check the blood flow to your arms and legs. If your test result is not normal, more testing may be needed. What happens before the procedure?  Follow instructions from your doctor about what you cannot eat or drink. ? Do not have any drinks or foods that have caffeine in them for 24 hours before the test, or as told by your doctor. This includes coffee, tea (even decaf tea), sodas, chocolate, and cocoa.  Ask your doctor about changing or stopping your normal medicines. This is important if you: ? Take diabetes medicines. ? Take beta-blocker medicines. ? Wear a nitroglycerin patch.  If you use an inhaler, bring it with you to the test.  Do not put lotions, powders, creams, or oils on your chest before  the test.  Wear comfortable shoes and clothing.  Do not use any products that have nicotine or tobacco in them, such as cigarettes and e-cigarettes. Stop using them at least 4 hours before the test. If you need help quitting, ask your doctor. What happens during the procedure?   Patches (electrodes) will be put on your chest.  Wires will be connected to the patches. The wires will send signals to a  machine to record your heartbeat.  Your heart rate will be watched while you are resting and while you are exercising. Your blood pressure will also be watched during the test.  You will walk on a treadmill or use a stationary bike. If you cannot use these, you may be asked to turn a crank with your hands.  The activity will get harder and will raise your heart rate.  You may be asked to breathe into a tube a few times during the test. This measures the gases that you breathe out.  You will be asked how you are feeling throughout the test.  You will exercise until your heart reaches a target heart rate. You will stop early if: ? You feel dizzy. ? You have chest pain. ? You are out of breath. ? Your blood pressure is too high or too low. ? You have an irregular heartbeat. ? You have pain or aching in your arms or legs. The procedure may vary among doctors and hospitals. What happens after the procedure?  Your blood pressure, heart rate, breathing rate, and blood oxygen level will be watched after the test.  You may return to your normal diet and activities as told by your doctor.  It is up to you to get the results of your test. Ask your doctor, or the department that is doing the test, when your results will be ready. Summary  An exercise stress test is a test to check how your heart works during exercise.  This test is done to check for coronary artery disease.  Your heart rate will be watched while you are resting and while you are exercising.  Follow instructions from your doctor about what you cannot eat or drink before the test. This information is not intended to replace advice given to you by your health care provider. Make sure you discuss any questions you have with your health care provider. Document Revised: 01/22/2019 Document Reviewed: 01/10/2017 Elsevier Patient Education  2020 [employer].    Echocardiogram An echocardiogram is a procedure that uses  painless sound waves (ultrasound) to produce an image of the heart. Images from an echocardiogram can provide important information about:  Signs of coronary artery disease (CAD).  Aneurysm detection. An aneurysm is a weak or damaged part of an artery wall that bulges out from the normal force of blood pumping through the body.  Heart size and shape. Changes in the size or shape of the heart can be associated with certain conditions, including heart failure, aneurysm, and CAD.  Heart muscle function.  Heart valve function.  Signs of a past heart attack.  Fluid buildup around the heart.  Thickening of the heart muscle.  A tumor or infectious growth around the heart valves. Tell a health care provider about:  Any allergies you have.  All medicines you are taking, including vitamins, herbs, eye drops, creams, and over-the-counter medicines.  Any blood disorders you have.  Any surgeries you have had.  Any medical conditions you have.  Whether you are pregnant or may  be pregnant. What are the risks? Generally, this is a safe procedure. However, problems may occur, including:  Allergic reaction to dye (contrast) that may be used during the procedure. What happens before the procedure? No specific preparation is needed. You may eat and drink normally. What happens during the procedure?   An IV tube may be inserted into one of your veins.  You may receive contrast through this tube. A contrast is an injection that improves the quality of the pictures from your heart.  A gel will be applied to your chest.  A wand-like tool (transducer) will be moved over your chest. The gel will help to transmit the sound waves from the transducer.  The sound waves will harmlessly bounce off of your heart to allow the heart images to be captured in real-time motion. The images will be recorded on a computer. The procedure may vary among health care providers and hospitals. What happens after  the procedure?  You may return to your normal, everyday life, including diet, activities, and medicines, unless your health care provider tells you not to do that. Summary  An echocardiogram is a procedure that uses painless sound waves (ultrasound) to produce an image of the heart.  Images from an echocardiogram can provide important information about the size and shape of your heart, heart muscle function, heart valve function, and fluid buildup around your heart.  You do not need to do anything to prepare before this procedure. You may eat and drink normally.  After the echocardiogram is completed, you may return to your normal, everyday life, unless your health care provider tells you not to do that. This information is not intended to replace advice given to you by your health care provider. Make sure you discuss any questions you have with your health care provider. Document Revised: 01/31/2019 Document Reviewed: 11/12/2016 Elsevier Patient Education  [place].

## 2020-02-05 ENCOUNTER — Ambulatory Visit (HOSPITAL_COMMUNITY): Payer: Medicare Other | Attending: Cardiovascular Disease

## 2020-02-05 ENCOUNTER — Other Ambulatory Visit: Payer: Self-pay

## 2020-02-05 DIAGNOSIS — R55 Syncope and collapse: Secondary | ICD-10-CM | POA: Insufficient documentation

## 2020-02-05 DIAGNOSIS — R079 Chest pain, unspecified: Secondary | ICD-10-CM | POA: Insufficient documentation

## 2020-02-14 ENCOUNTER — Other Ambulatory Visit (HOSPITAL_COMMUNITY): Payer: Medicare Other

## 2020-03-12 ENCOUNTER — Telehealth: Payer: Self-pay | Admitting: Cardiovascular Disease

## 2020-03-12 DIAGNOSIS — I209 Angina pectoris, unspecified: Secondary | ICD-10-CM

## 2020-03-12 DIAGNOSIS — R079 Chest pain, unspecified: Secondary | ICD-10-CM

## 2020-03-12 DIAGNOSIS — E782 Mixed hyperlipidemia: Secondary | ICD-10-CM

## 2020-03-12 DIAGNOSIS — R55 Syncope and collapse: Secondary | ICD-10-CM

## 2020-03-12 DIAGNOSIS — Z01812 Encounter for preprocedural laboratory examination: Secondary | ICD-10-CM

## 2020-03-12 DIAGNOSIS — I10 Essential (primary) hypertension: Secondary | ICD-10-CM

## 2020-03-12 NOTE — Telephone Encounter (Signed)
Left message for patient to call back.  Per Dr. Eden Emms, EF normal no significant valve disease overall good.

## 2020-03-12 NOTE — Telephone Encounter (Signed)
Patient called back. Informed patient of echo results. Patient stated she needs to cancel her GXT and possibly put it off for about another month due to having plantar fasciitis that is acting up. Informed patient that we could see if Dr. Eden Emms might change her test to a lexiscan myoview. Will forward to Dr. Eden Emms to see if we can order a different test.

## 2020-03-12 NOTE — Telephone Encounter (Signed)
   Pt is returning call from [facility] about her echo results. She said her phone has been acting up and gave her husband's number to callback

## 2020-03-13 MED ORDER — METOPROLOL TARTRATE 100 MG PO TABS: ORAL_TABLET | ORAL | 0 refills | Status: AC

## 2020-03-13 NOTE — Telephone Encounter (Signed)
Can order cardiac CTA give 100 mg PO lopressor 2 hours before scan

## 2020-03-13 NOTE — Telephone Encounter (Signed)
Your cardiac CT will be scheduled at one of the below locations:   [facility] [address] [place], [place] [address] [phone]  If scheduled at [facility], please arrive at the [facility] main entrance of [facility] 30 minutes prior to test start time. Proceed to the [facility] Radiology Department (first floor) to check-in and test prep.  Please follow these instructions carefully (unless otherwise directed):   On the Night Before the Test: . Be sure to Drink plenty of water. . Do not consume any caffeinated/decaffeinated beverages or chocolate 12 hours prior to your test. . Do not take any antihistamines 12 hours prior to your test.  On the Day of the Test: . Drink plenty of water. Do not drink any water within one hour of the test. . Do not eat any food 4 hours prior to the test. . You may take your regular medications prior to the test.  . Take metoprolol (Lopressor) 100 mg two hours prior to test. . HOLD Hydrochlorothiazide morning of the test.      After the Test: . Drink plenty of water. . After receiving IV contrast, you may experience a mild flushed feeling. This is normal. . On occasion, you may experience a mild rash up to 24 hours after the test. This is not dangerous. If this occurs, you can take Benadryl 25 mg and increase your fluid intake. . If you experience trouble breathing, this can be serious. If it is severe call 911 IMMEDIATELY. If it is mild, please call our office.  Once we have confirmed authorization from your insurance company, we will call you to set up a date and time for your test.   For non-scheduling related questions, please contact the cardiac imaging nurse navigator should you have any questions/concerns: Marchia Bond, Cardiac Imaging Nurse Navigator Burley Saver, Interim Cardiac Imaging Nurse [facility] [facility] Direct Office Dial: [phone]   For scheduling needs,  including cancellations and rescheduling, please call [phone].   Went over instructions with patient as written above. Will mail patient a copy.

## 2020-03-20 ENCOUNTER — Inpatient Hospital Stay (HOSPITAL_COMMUNITY): Admission: RE | Admit: 2020-03-20 | Payer: Medicare Other | Source: Ambulatory Visit

## 2020-03-30 ENCOUNTER — Telehealth: Payer: Self-pay | Admitting: Internal Medicine

## 2020-03-30 NOTE — Telephone Encounter (Signed)
Spoke with pt and she is aware of Dr. Lamar Sprinkles recommendations. Pt states she will call Dr. Fabio Bering office to check on an appt with electrophysiologist.

## 2020-03-30 NOTE — Telephone Encounter (Signed)
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 She was at the funeral home planning her mother-in-laws funeral and she broke out in a cold sweat and passed out within a minute. Then she was very weak and threw up then had diarrhea with blood. She is afraid that this is going to happen to her while she is driving. Pt wants to know what else can be done to work this up and find a cause. Pt wondered about the small bowel. Please advise.

## 2020-03-30 NOTE — Telephone Encounter (Signed)
I am sorry that she had this episode.  I appreciate her calling to provide an update.  1.  We did not find any mesenteric vascular disease on her CT angiography 2.  Recent colonoscopy was normal 3.  She did see general cardiologist, Dr. Eden Emms.  No changes in management 4.  Do not drive if you have any concerns 5.  She told me before that she does NOT get abdominal pain before the syncopal episodes.  However, if she now reports abdominal discomfort before syncopal episodes, then we could prescribe Levsin sublingual 0.125 mg.  Could take 1 or 2 every 4 hours as needed for abdominal pain 6.  When feeling a syncopal episode coming on, get to the floor 7.  May be worth getting an opinion from electrophysiologist this recurrent and troublesome syncope.  She is on beta-blocker.

## 2020-03-30 NOTE — Telephone Encounter (Signed)
Pt also states she does not have abd pain prior to the episode, states she has sharp abd pain after she has the syncopal episode.

## 2020-03-30 NOTE — Telephone Encounter (Signed)
Patient is calling- states that she has had a episode of ischemic colitis and that she was told to call in anytime she does. Wanting to speak with you.

## 2020-03-31 ENCOUNTER — Other Ambulatory Visit: Payer: Medicare Other

## 2020-04-02 ENCOUNTER — Other Ambulatory Visit: Payer: Self-pay

## 2020-04-02 ENCOUNTER — Other Ambulatory Visit: Payer: Medicare Other | Admitting: *Deleted

## 2020-04-02 DIAGNOSIS — I10 Essential (primary) hypertension: Secondary | ICD-10-CM

## 2020-04-02 DIAGNOSIS — E782 Mixed hyperlipidemia: Secondary | ICD-10-CM

## 2020-04-02 DIAGNOSIS — Z01812 Encounter for preprocedural laboratory examination: Secondary | ICD-10-CM

## 2020-04-02 DIAGNOSIS — R55 Syncope and collapse: Secondary | ICD-10-CM

## 2020-04-02 DIAGNOSIS — R079 Chest pain, unspecified: Secondary | ICD-10-CM

## 2020-04-02 LAB — BASIC METABOLIC PANEL
BUN/Creatinine Ratio: 12 (ref 12–28)
BUN: 12 mg/dL (ref 8–27)
CO2: 28 mmol/L (ref 20–29)
Calcium: 10 mg/dL (ref 8.7–10.3)
Chloride: 101 mmol/L (ref 96–106)
Creatinine, Ser: 1.01 mg/dL — ABNORMAL HIGH (ref 0.57–1.00)
GFR calc Af Amer: 66 mL/min/{1.73_m2} (ref >59)
GFR calc non Af Amer: 57 mL/min/{1.73_m2} — ABNORMAL LOW (ref >59)
Glucose: 84 mg/dL (ref 65–99)
Potassium: 4.6 mmol/L (ref 3.5–5.2)
Sodium: 140 mmol/L (ref 134–144)

## 2020-04-03 ENCOUNTER — Telehealth (HOSPITAL_COMMUNITY): Payer: Self-pay | Admitting: *Deleted

## 2020-04-03 NOTE — Telephone Encounter (Signed)
Reaching out to patient to offer assistance regarding upcoming cardiac imaging study; pt verbalizes understanding of appt date/time, parking situation and where to check in, pre-test NPO status and medications ordered, and verified current allergies; name and call back number provided for further questions should they arise  Amoni Morales Tai RN Navigator Cardiac Imaging [facility] [phone] office [phone] cell 

## 2020-04-06 ENCOUNTER — Ambulatory Visit (HOSPITAL_COMMUNITY)
Admission: RE | Admit: 2020-04-06 | Discharge: 2020-04-06 | Disposition: A | Discharge status: Home / Self Care | Payer: Medicare Other | Source: Ambulatory Visit | Attending: Cardiovascular Disease | Admitting: Cardiovascular Disease

## 2020-04-06 ENCOUNTER — Other Ambulatory Visit: Payer: Self-pay

## 2020-04-06 DIAGNOSIS — I10 Essential (primary) hypertension: Secondary | ICD-10-CM | POA: Diagnosis present

## 2020-04-06 DIAGNOSIS — I209 Angina pectoris, unspecified: Secondary | ICD-10-CM | POA: Diagnosis not present

## 2020-04-06 DIAGNOSIS — E782 Mixed hyperlipidemia: Secondary | ICD-10-CM | POA: Diagnosis present

## 2020-04-06 DIAGNOSIS — R55 Syncope and collapse: Secondary | ICD-10-CM

## 2020-04-06 DIAGNOSIS — R079 Chest pain, unspecified: Secondary | ICD-10-CM

## 2020-04-06 MED ORDER — NITROGLYCERIN 0.4 MG SL SUBL
0.8000 mg | SUBLINGUAL_TABLET | Freq: Once | SUBLINGUAL | Status: AC
  Administered 2020-04-06: 0.8 mg via SUBLINGUAL

## 2020-04-06 MED ORDER — IOHEXOL 350 MG/ML SOLN
80.0000 mL | Freq: Once | INTRAVENOUS | Status: AC | PRN
  Administered 2020-04-06: 80 mL via INTRAVENOUS

## 2020-04-06 MED ORDER — NITROGLYCERIN 0.4 MG SL SUBL
SUBLINGUAL_TABLET | SUBLINGUAL | Status: AC
  Filled 2020-04-06: qty 2

## 2020-04-06 NOTE — Progress Notes (Signed)
CT scan completed. Tolerated well. D/C home ambulatory, awake and alert. In no distress. 

## 2020-04-16 ENCOUNTER — Telehealth: Payer: Self-pay

## 2020-04-16 DIAGNOSIS — R55 Syncope and collapse: Secondary | ICD-10-CM

## 2020-04-16 NOTE — Telephone Encounter (Signed)
-----   Message from Wendall Stade, MD sent at 04/07/2020  8:43 AM EDT ----- Calcium score 0 normal coronary arteries good

## 2020-04-16 NOTE — Telephone Encounter (Signed)
Called patient about results. Patient stated she had another syncopal episode and was wondering what this could be. Dr. Eden Emms stated her CT looked good. Will try ordering a 30 day monitor and see if that shows anything.

## 2020-04-17 ENCOUNTER — Telehealth: Payer: Self-pay | Admitting: Radiology

## 2020-04-17 NOTE — Telephone Encounter (Signed)
Enrolled patient for a 30 day [employer] Event Monitor to be mailed to patients home. Brief instructions were gone over with patient and she knows to expect monitor to arrive in 5-7 days.

## 2020-04-28 ENCOUNTER — Ambulatory Visit (INDEPENDENT_AMBULATORY_CARE_PROVIDER_SITE_OTHER): Payer: Medicare Other

## 2020-04-28 DIAGNOSIS — R55 Syncope and collapse: Secondary | ICD-10-CM

## 2021-01-06 ENCOUNTER — Other Ambulatory Visit: Payer: Self-pay | Admitting: Physician Assistant

## 2021-01-06 DIAGNOSIS — K559 Vascular disorder of intestine, unspecified: Secondary | ICD-10-CM

## 2021-02-12 ENCOUNTER — Ambulatory Visit: Payer: Medicare Other | Admitting: Physician Assistant

## 2021-02-23 ENCOUNTER — Encounter: Payer: Self-pay | Admitting: Nurse Practitioner

## 2021-02-23 ENCOUNTER — Ambulatory Visit (INDEPENDENT_AMBULATORY_CARE_PROVIDER_SITE_OTHER): Payer: Medicare Other | Admitting: Nurse Practitioner

## 2021-02-23 VITALS — BP 110/40 | HR 69 | Ht 63.0 in | Wt 113.0 lb

## 2021-02-23 DIAGNOSIS — R55 Syncope and collapse: Secondary | ICD-10-CM | POA: Diagnosis not present

## 2021-02-23 NOTE — Progress Notes (Signed)
ASSESSMENT AND PLAN    #70 year old female with recurrent episodes of syncope followed by vomiting and bloody diarrhea.  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 Interestingly episodes are never preceded by abdominal pain as would be expected in ischemic colitis.  We referred her to cardiology for evaluation of vasovagal symptoms but work-up was negative.  -- Will discuss with Dr. Marina Goodell. The sequence of events during these episodes suggest that her GI symptoms are secondary to some other process which has not yet been determined.     HISTORY OF PRESENT ILLNESS    Chief Complaint : Ongoing episodes of syncope and bloody diarrhea  Jill Howe is a 69 y.o. female known to Dr. Marina Goodell with a past medical history significant for COPD, hypertension, bipolar disorder, osteoarthritis, hyperlipidemia.  See PMH below for any additional medical problems.    Feb 2021 - last seen by Dr. Marina Goodell for vasovagal symptoms / syncope with subsequent abdominal pain and rectal bleeding. Symptoms in setting of a history of segmental colitis, likely ischemic.  She underwent colonoscopy March 2021 to assess for persistent colitis (it was normal). She was referred to Dr. Ladona Ridgel (  EP) regarding syncope episodes.   Patient was actually seen by Dr. Eden Emms.  She had an unremarkable coronary CT. echocardiogram showed no significant disease.  She had cardiac event monitoring which showed normal sinus rhythm.   INTERVAL HISTORY: Patient returns after having three more "episodes" since seeing [place] in June 2021.  As usual the episodes have started with patient becoming diaphoretic then having a syncopal episode .Sometimes episodes are followed by vomiting and then the bloody diarrhea starts.  She had an episode in June 2021, October 2021 and January 2022. Patient tells me her husband said the worst episode was this last one in January. She apparently  remained disoriented for a prolonged period and had multiple episodes of vomiting.  Husband got her into [employer] and EMS was called.  Patient says her her vital signs were completely stable when checked by EMS .  Of note, we had held her diuretics thinking that hypotension/hypovolemia may have led to hypotension causing the syncope and hypoperfusion of the colon with resultant acute ischemic colitis.  Patient says today that her last 3 episodes were while she was off diuretics. Patient says she drinks a lot of water and avoids caffeine.  She has found no relationship between these episodes and position.   Labs by PCP on 02/08/21 include hgb of 11.7. CBC otherwise normal.   PREVIOUS ENDOSCOPIC EVALUATIONS / PERTINENT STUDIES:   March 2021 --Normal. Repeat in 5 years given [facility] of colon polyps.     Past Medical History:  Diagnosis Date  . Asthma    no inhalers  . Barrett esophagus   . Bipolar disorder (HCC)   . Constipation    chronic  . Depression   . Endometriosis   . Fibromyalgia   . GERD (gastroesophageal reflux disease)    Small area of Barretts esophagus and hiatal hernia  . Hiatal hernia   . Hyperlipidemia   . Hypertension   . Irritable bowel syndrome   . NSVT (nonsustained ventricular tachycardia) (HCC)   . OA (osteoarthritis)   . PVC's (premature ventricular contractions)    over 10 years  . Restless leg syndrome   . Syncope    in the past    Current Medications, Allergies, Past Surgical History,  Family History and Social History were reviewed in [employer] record.   Current Outpatient Medications  Medication Sig Dispense Refill  . aspirin 81 MG chewable tablet Chew by mouth daily.    . baclofen (LIORESAL) 10 MG tablet Take 10 mg by mouth 2 (two) times daily.    . Calcium Carbonate-Vit D-Min (CALCIUM 1200 PO) Take by mouth daily.    . hydrochlorothiazide (HYDRODIURIL) 25 MG tablet Take 25 mg by mouth daily.    Marland Kitchen lamoTRIgine  (LAMICTAL) 200 MG tablet Take 150 mg by mouth daily.     Marland Kitchen lisinopril (PRINIVIL,ZESTRIL) 20 MG tablet Take 20 mg by mouth daily.    . metoprolol tartrate (LOPRESSOR) 100 MG tablet Take metoprolol  2 hours prior to CT test. 1 tablet 0  . Multiple Vitamin (MULTIVITAMIN) tablet Take 1 tablet by mouth daily.    . simvastatin (ZOCOR) 20 MG tablet Take 10 mg by mouth daily.     No current facility-administered medications for this visit.    Review of Systems: No chest pain. No shortness of breath. No urinary complaints.   PHYSICAL EXAM :    Wt Readings from Last 3 Encounters:  02/23/21 113 lb (51.3 kg)  01/17/20 127 lb (57.6 kg)  12/24/19 116 lb (52.6 kg)    BP (!) 110/40   Pulse 69   Ht 5\' 3"  (1.6 m)   Wt 113 lb (51.3 kg)   SpO2 99%   BMI 20.02 kg/m  Constitutional:  Pleasant female in no acute distress. Psychiatric: Normal mood and affect. Behavior is normal. EENT: Pupils normal.  Conjunctivae are normal. No scleral icterus. Neck supple.  Cardiovascular: Normal rate, regular rhythm. No edema Pulmonary/chest: Effort normal and breath sounds normal. No wheezing, rales or rhonchi. Abdominal: Soft, nondistended, nontender. Bowel sounds active throughout. There are no masses palpable. No hepatomegaly. Neurological: Alert and oriented to person place and time. Skin: Skin is warm and dry. No rashes noted.  I spent 30 minutes total reviewing records, obtaining history, performing exam, counseling patient and documenting visit / findings.   , NP  02/23/2021, 3:29 PM

## 2021-02-23 NOTE — Patient Instructions (Signed)
Will call if Dr. Marina Goodell has any recommendations.   If you are age 69 or older, your body mass index should be between 23-30. Your Body mass index is 20.02 kg/m. If this is out of the aforementioned range listed, please consider follow up with your Primary Care Provider.  If you are age 70 or younger, your body mass index should be between 19-25. Your Body mass index is 20.02 kg/m. If this is out of the aformentioned range listed, please consider follow up with your Primary Care Provider.    Thank you for choosing me and [facility].  Willette Cluster NP

## 2021-02-23 NOTE — Progress Notes (Signed)
Gunnar Fusi, Assessment Noted. Unusual case. It would be helpful to see her when she has this bleeding. Thanks

## 2021-02-24 NOTE — Progress Notes (Signed)
Beth, please contact the patient.  Tell her I discussed her case with Dr. Marina Goodell.  If possible, we should see her during of these episodes . Thanks

## 2021-03-08 NOTE — Progress Notes (Signed)
Beth, good question. I don't know the answer to that. Dr. Marina Goodell thought is would be helpful to see her during an episode but I don't know if that is possible as he nor anyone else may not having an opening at that time but she should at least call the office to see. Thanks

## 2021-03-10 NOTE — Progress Notes (Signed)
Patient is notified of the plan. She will give my name to her husband so when it happens again, he will call us.

## 2021-05-17 ENCOUNTER — Other Ambulatory Visit: Payer: Self-pay | Admitting: Specialist

## 2021-05-17 DIAGNOSIS — K869 Disease of pancreas, unspecified: Secondary | ICD-10-CM

## 2021-05-20 ENCOUNTER — Other Ambulatory Visit: Payer: Self-pay | Admitting: Specialist

## 2021-05-20 DIAGNOSIS — Z87898 Personal history of other specified conditions: Secondary | ICD-10-CM

## 2021-06-01 ENCOUNTER — Other Ambulatory Visit: Payer: Medicare Other

## 2021-06-01 ENCOUNTER — Ambulatory Visit
Admission: RE | Admit: 2021-06-01 | Discharge: 2021-06-01 | Disposition: A | Discharge status: Home / Self Care | Payer: Medicare Other | Source: Ambulatory Visit | Attending: Specialist | Admitting: Specialist

## 2021-06-01 ENCOUNTER — Other Ambulatory Visit: Payer: Self-pay

## 2021-06-01 DIAGNOSIS — K869 Disease of pancreas, unspecified: Secondary | ICD-10-CM

## 2021-06-01 DIAGNOSIS — Z87898 Personal history of other specified conditions: Secondary | ICD-10-CM

## 2021-06-01 MED ORDER — GADOBENATE DIMEGLUMINE 529 MG/ML IV SOLN
9.0000 mL | Freq: Once | INTRAVENOUS | Status: AC | PRN
  Administered 2021-06-01: 9 mL via INTRAVENOUS

## 2022-03-31 ENCOUNTER — Other Ambulatory Visit: Payer: Self-pay | Admitting: Internal Medicine

## 2022-03-31 DIAGNOSIS — Z1231 Encounter for screening mammogram for malignant neoplasm of breast: Secondary | ICD-10-CM

## 2022-04-20 ENCOUNTER — Ambulatory Visit: Payer: Medicare Other

## 2022-04-27 ENCOUNTER — Ambulatory Visit
Admission: RE | Admit: 2022-04-27 | Discharge: 2022-04-27 | Disposition: A | Discharge status: Home / Self Care | Payer: Medicare Other | Source: Ambulatory Visit | Attending: Internal Medicine | Admitting: Internal Medicine

## 2022-04-27 DIAGNOSIS — Z1231 Encounter for screening mammogram for malignant neoplasm of breast: Secondary | ICD-10-CM

## 2022-04-29 ENCOUNTER — Other Ambulatory Visit: Payer: Self-pay | Admitting: Internal Medicine

## 2022-04-29 DIAGNOSIS — R928 Other abnormal and inconclusive findings on diagnostic imaging of breast: Secondary | ICD-10-CM

## 2022-05-06 ENCOUNTER — Ambulatory Visit
Admission: RE | Admit: 2022-05-06 | Discharge: 2022-05-06 | Disposition: A | Discharge status: Home / Self Care | Payer: Medicare Other | Source: Ambulatory Visit | Attending: Internal Medicine | Admitting: Internal Medicine

## 2022-05-06 DIAGNOSIS — R928 Other abnormal and inconclusive findings on diagnostic imaging of breast: Secondary | ICD-10-CM

## 2023-04-19 IMAGING — MR MR ABDOMEN WO/W CM
13 of 21 series · 25 of 48 positions shown · IV contrast (9ml multihance)
Comparison: CT angiogram abdomen and pelvis, 11/15/2019, CT abdomen
pelvis, 09/27/2019

CLINICAL DATA: Follow-up pancreatic cysts

EXAM:
MRI ABDOMEN WITHOUT AND WITH CONTRAST
TECHNIQUE: Multiplanar multisequence MR imaging of the abdomen was performed
both before and after the administration of intravenous contrast.
CONTRAST:  9mL MULTIHANCE GADOBENATE DIMEGLUMINE 529 MG/ML IV SOLN

[Series 5: cor haste · coronal · 5.0mm · 0.74mm/px · 2 of 30 slices shown]
[im 1/30]
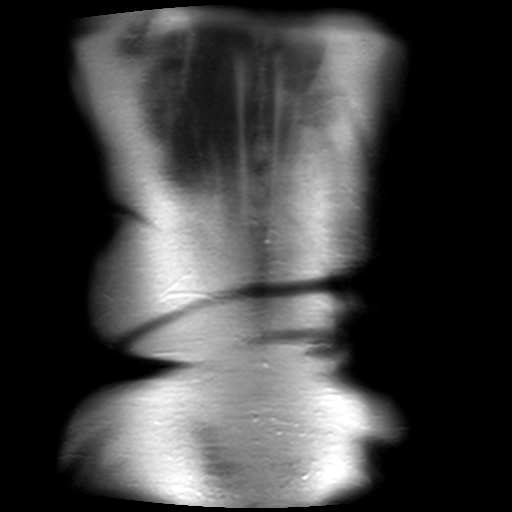
[im 30/30]
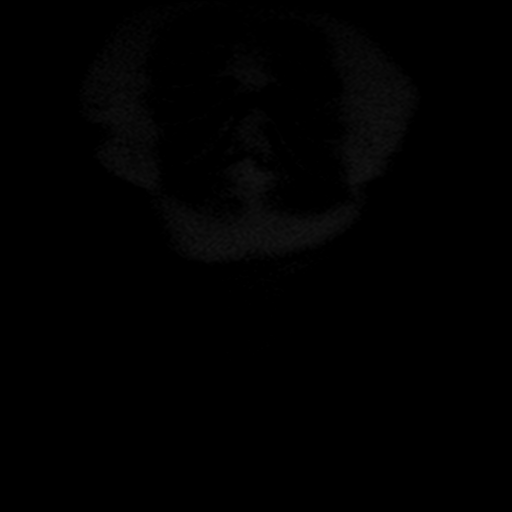

[Series 6: axial haste · axial · 6.0mm · 0.78mm/px · 1 of 31 slices shown]
[im 1/31]
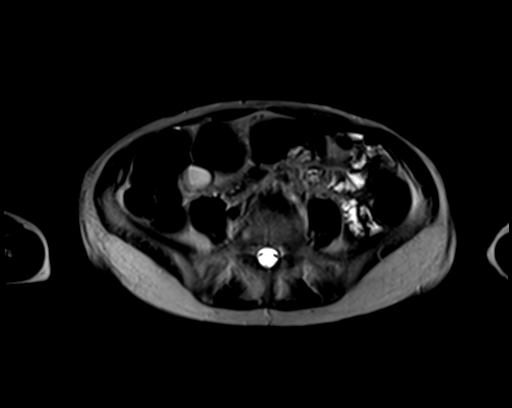

[Series 11: MRCP · coronal · 0.99mm/px · 1 of 7 slices shown]
[im 1/7]
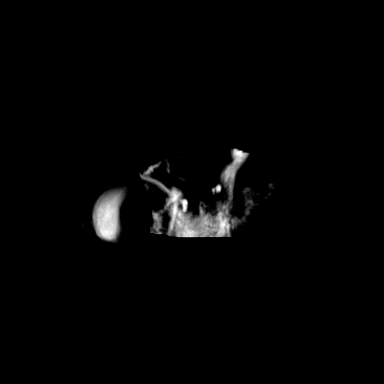

[Series 13: ep2d_diff_b50_500_[id number]_trig · axial · 6.0mm · 1.98mm/px · z∈[-507,-298]mm · 3 of 90 slices shown]
[im 1/90]
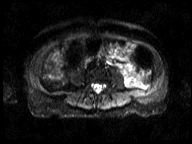
[im 45/90]
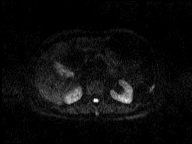
[im 90/90]
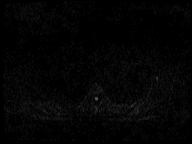

[Series 14: ep2d_diff_b50_500_800_p2_trig_adc · axial · 6.0mm · 1.98mm/px · 1 of 30 slices shown]
[im 1/30]
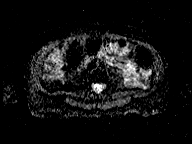

[Series 15: T2 · axial · 6.0mm · 1.12mm/px · 1 of 30 slices shown (1 of 2)]
[im 1/30]
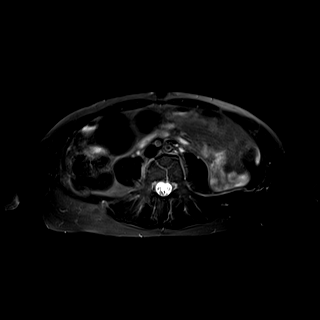

[Series 16: bSSFP · coronal · 5.0mm · 0.78mm/px · 1 of 29 slices shown (1 of 2)]
[im 1/29]
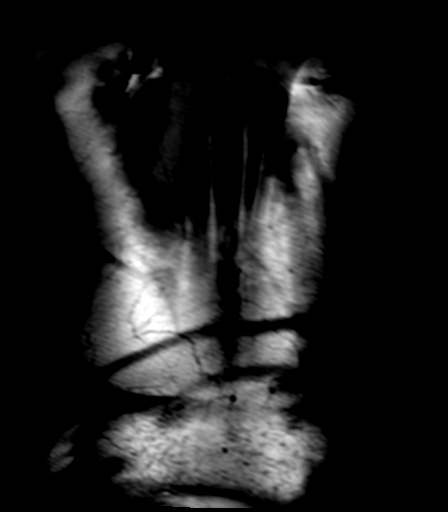

[Series 17: T2 · coronal · 3.0mm · 0.70mm/px · 2 of 48 slices shown (2 of 2)]
[im 1/48]
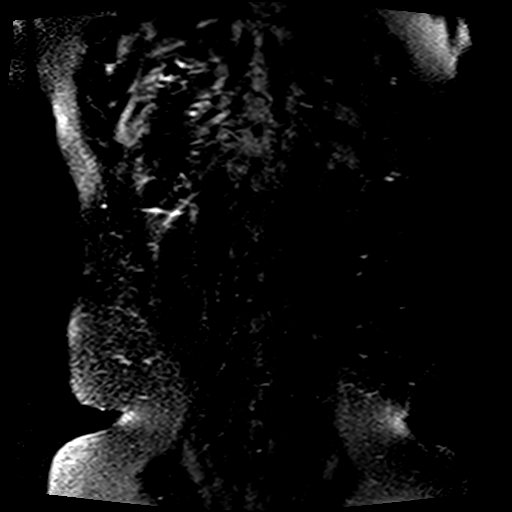
[im 48/48]
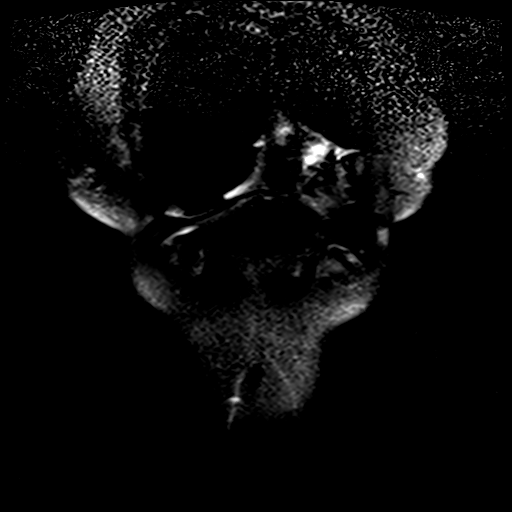

[Series 18: T1 · axial · 6.0mm · 0.74mm/px · z∈[-524,-333]mm · 2 of 60 slices shown]
[im 1/60]
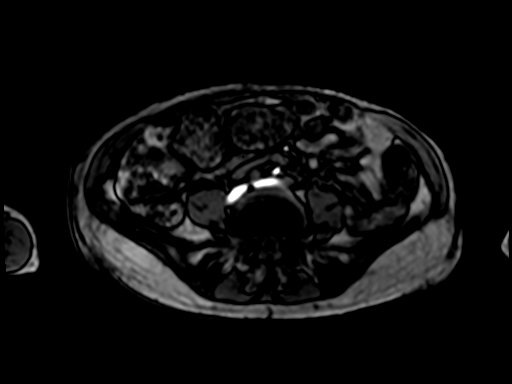
[im 60/60]
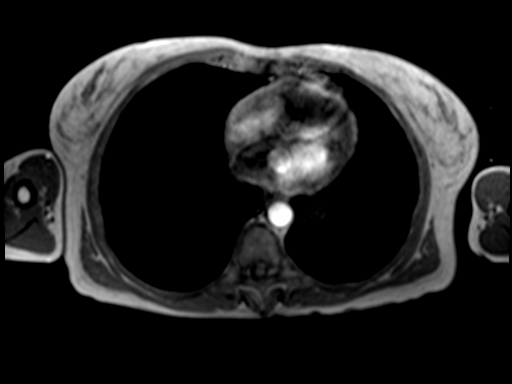

[Series 19: bSSFP · axial · 4.0mm · 0.74mm/px · z∈[-505,-334]mm · 2 of 44 slices shown (2 of 2)]
[im 1/44]
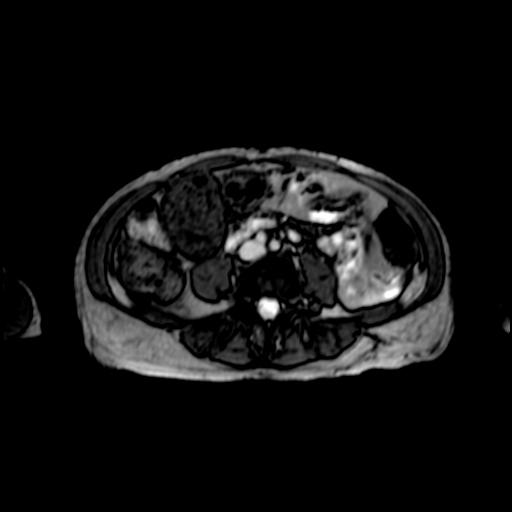
[im 44/44]
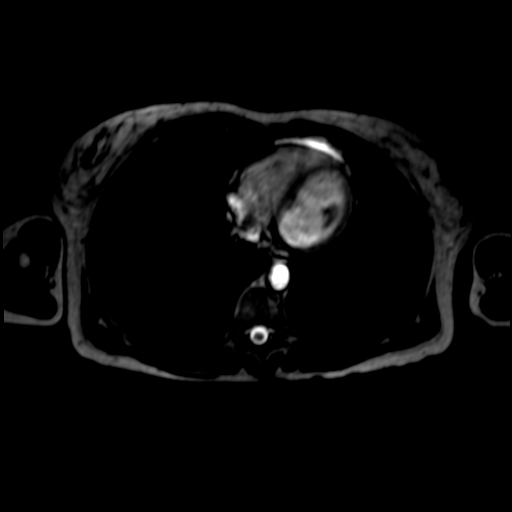

[Series 20: T1 dynamic · axial · non-contrast · 2.5mm · 0.74mm/px · z∈[-519,-322]mm · 3 of 80 slices shown]
[im 1/80]
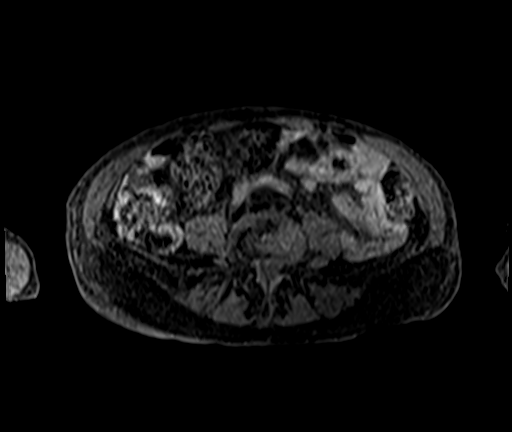
[im 40/80]
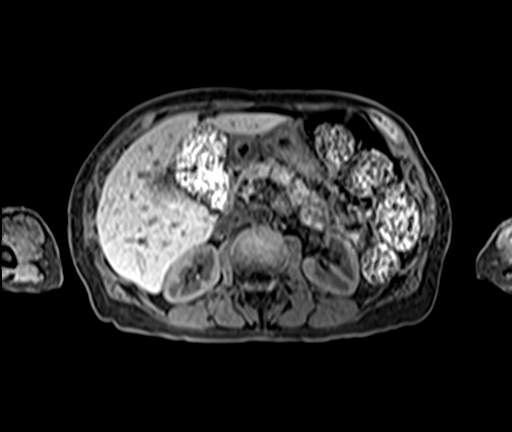
[im 80/80]
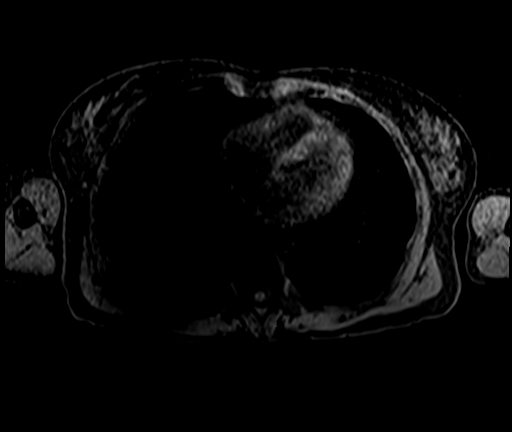

[Series 21: T1 dynamic post-contrast · axial · 2.5mm · 0.74mm/px · z∈[-519,-322]mm · 3 of 80 slices shown (1 of 2)]
[im 1/80]
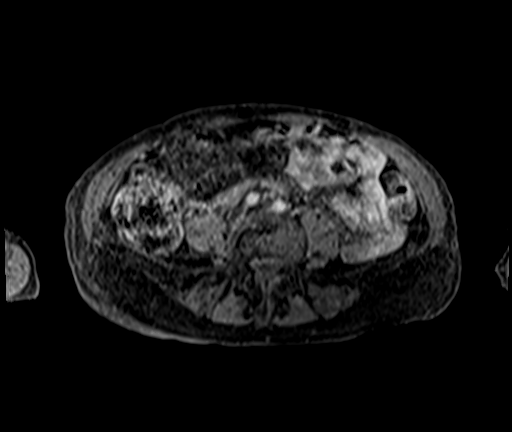
[im 40/80]
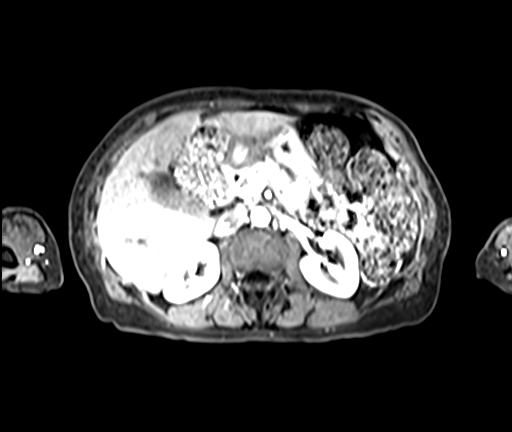
[im 80/80]
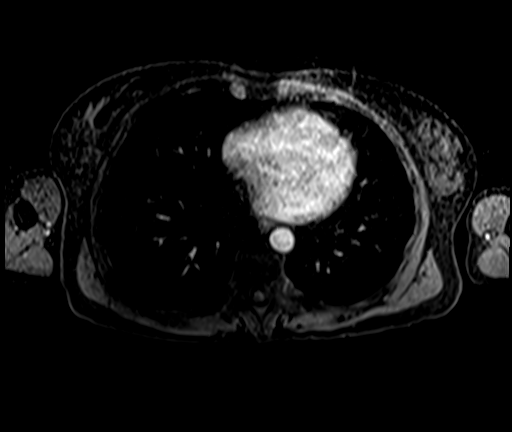

[Series 22: T1 dynamic post-contrast · axial · 2.5mm · 0.74mm/px · z∈[-519,-322]mm · 3 of 80 slices shown (2 of 2)]
[im 1/80]
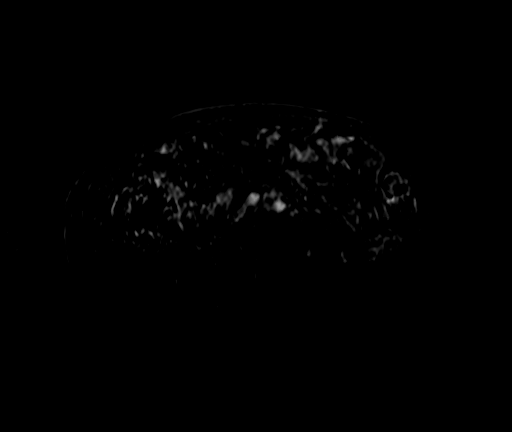
[im 40/80]
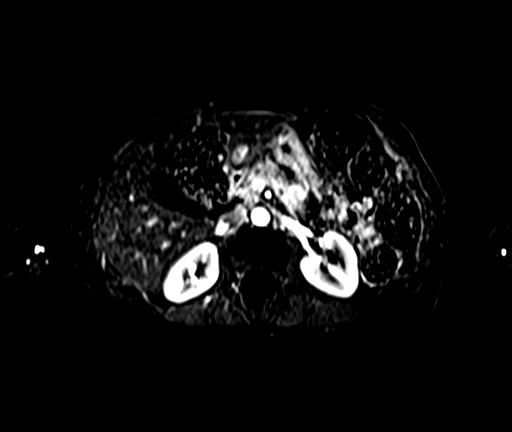
[im 80/80]
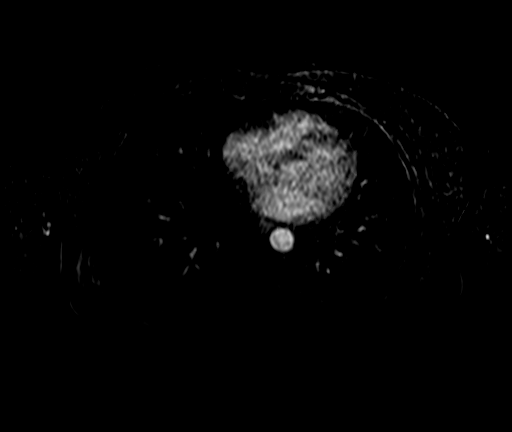

[25 of 48 positions shown; findings below may reference images not displayed]

FINDINGS: Lower chest: No acute findings.

Hepatobiliary: No mass or other parenchymal abnormality identified.

Pancreas: There are multiple small fluid signal lesions throughout
the pancreas, the largest in the pancreatic head measuring 0.7 cm
(series 6, image 17) and an additional lesion of the pancreatic body
measuring 0.6 cm (series 6, image 14). No associated contrast
enhancement. No solid mass, inflammatory changes, or other
parenchymal abnormality identified. No pancreatic ductal dilatation.

Spleen:  Within normal limits in size and appearance.

Adrenals/Urinary Tract: No masses identified. No evidence of
hydronephrosis.

Stomach/Bowel: Visualized portions within the abdomen are
unremarkable. Large burden of stool throughout the colon.

Vascular/Lymphatic: No pathologically enlarged lymph nodes
identified. No abdominal aortic aneurysm demonstrated.

Other:  None.

Musculoskeletal: No suspicious bone lesions identified.
IMPRESSION: 1. There are multiple small fluid signal lesions throughout the
pancreas, measuring 0.7 cm and smaller. These are unchanged compared
to prior examinations. These may reflect small side branch IPMNs or
small pancreatic pseudocysts. As there is no observed increased risk
of malignancy for such lesions smaller than 2 cm, no further
follow-up or characterization is required.
2. Large burden of stool throughout the colon.

## 2023-04-19 IMAGING — MR MR HEAD WO/W CM
12 series · 48 of 48 positions shown · IV contrast (multihance)
Comparison: Head CT 08/29/2018.

CLINICAL DATA: History of syncope [facility].GOG ([id number]). Additional
history provided by technologist: Patient reports frontal headaches
for 2 months.

EXAM:
MRI HEAD WITHOUT AND WITH CONTRAST
TECHNIQUE: Multiplanar, multiecho pulse sequences of the brain and surrounding
structures were obtained without and with intravenous contrast.
CONTRAST:  9mL MULTIHANCE GADOBENATE DIMEGLUMINE 529 MG/ML IV SOLN

[Series 2: T1 · sagittal · 5.0mm · 0.45mm/px · 3 of 24 slices shown]
[im 1/24]
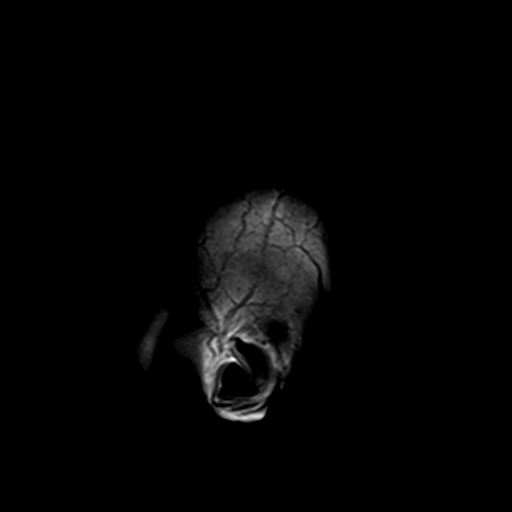
[im 12/24]
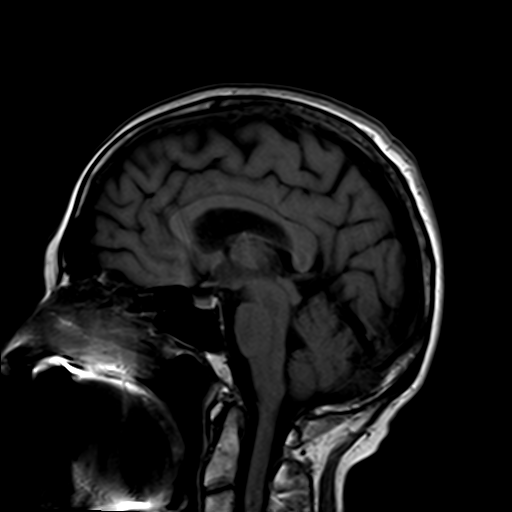
[im 24/24]
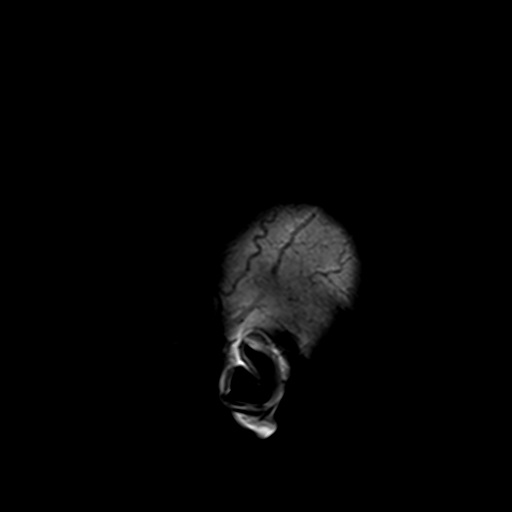

[Series 3: DWI · axial · 3.0mm · 1.80mm/px · z∈[-34,+103]mm · 6 of 94 slices shown (1 of 4)]
[im 1/94]
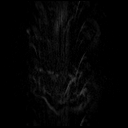
[im 19/94]
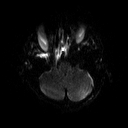
[im 38/94]
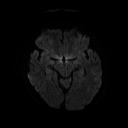
[im 56/94]
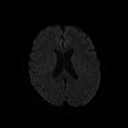
[im 75/94]
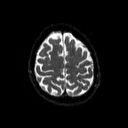
[im 94/94]
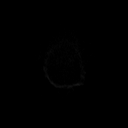

[Series 4: DWI · axial · 3.0mm · 1.80mm/px · z∈[-37,+103]mm · 3 of 50 slices shown (2 of 4)]
[im 1/50]
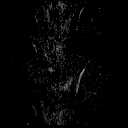
[im 25/50]
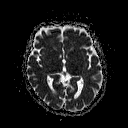
[im 50/50]
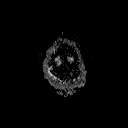

[Series 5: DWI · coronal · 5.0mm · 1.80mm/px · 4 of 64 slices shown (3 of 4)]
[im 1/64]
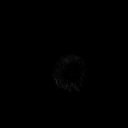
[im 22/64]
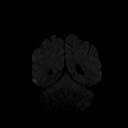
[im 43/64]
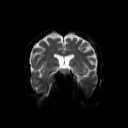
[im 64/64]
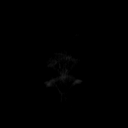

[Series 6: DWI · coronal · 5.0mm · 1.80mm/px · 2 of 35 slices shown (4 of 4)]
[im 1/35]
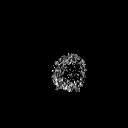
[im 35/35]
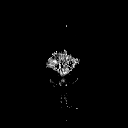

[Series 7: T2 · axial · 5.0mm · 0.60mm/px · z∈[-33,+102]mm · 2 of 22 slices shown (1 of 2)]
[im 1/22]
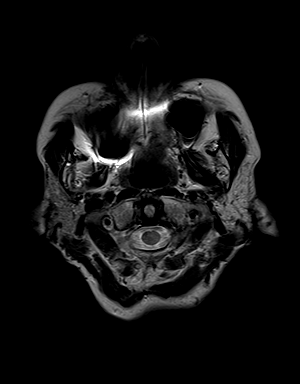
[im 22/22]
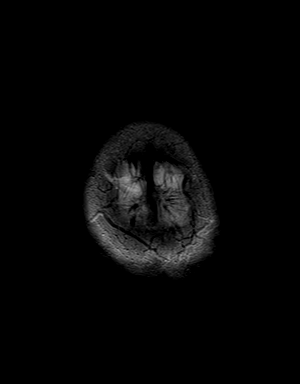

[Series 8: FLAIR · axial · 3.0mm · 0.45mm/px · z∈[-31,+102]mm · 2 of 31 slices shown]
[im 1/31]
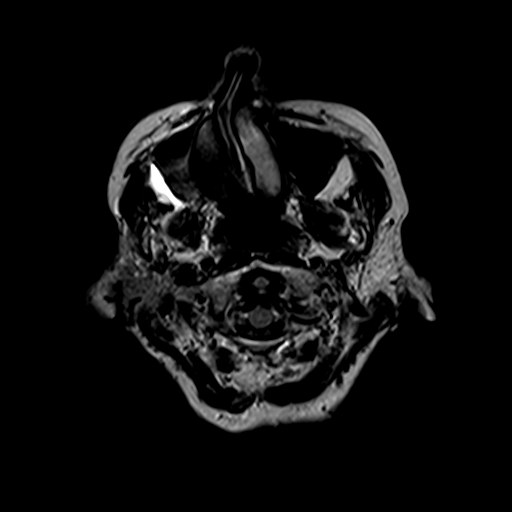
[im 31/31]
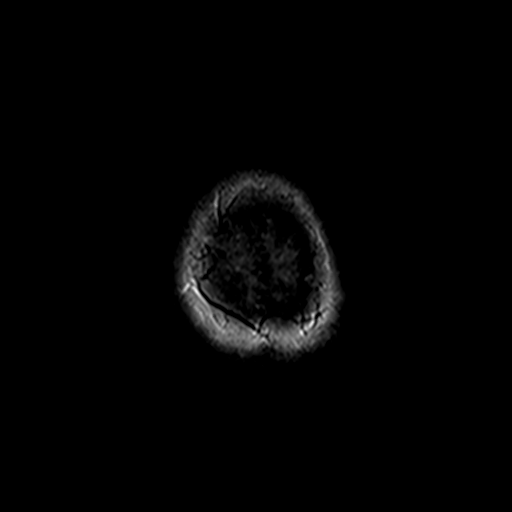

[Series 10: swi_images · axial · 4.0mm · 0.90mm/px · z∈[-33,+100]mm · 2 of 36 slices shown]
[im 1/36]
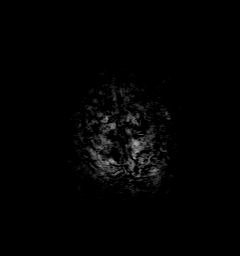
[im 36/36]
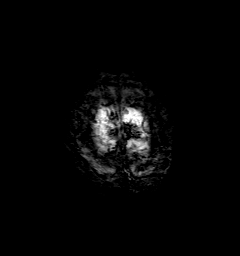

[Series 11: t1_mpr_tra · axial · 1.0mm · 0.75mm/px · z∈[-33,+104]mm · 10 of 144 slices shown]
[im 1/144]
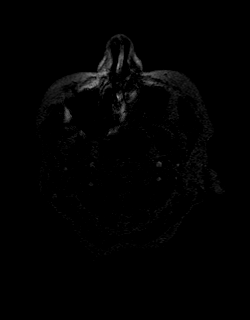
[im 16/144]
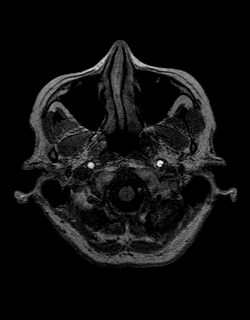
[im 32/144]
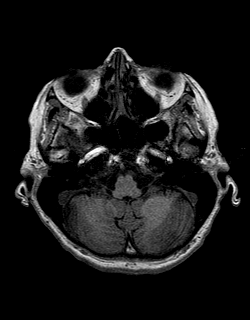
[im 48/144]
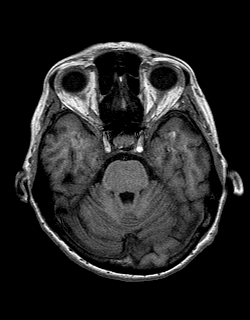
[im 64/144]
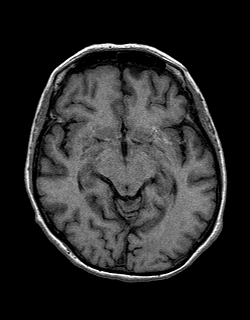
[im 80/144]
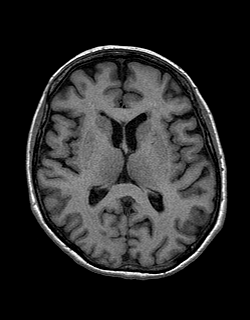
[im 96/144]
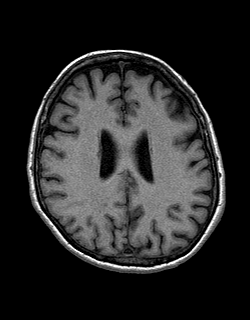
[im 112/144]
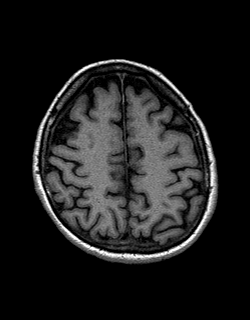
[im 128/144]
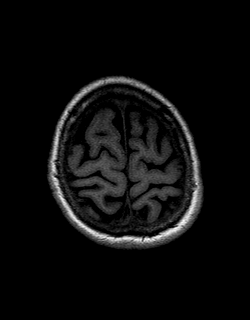
[im 144/144]
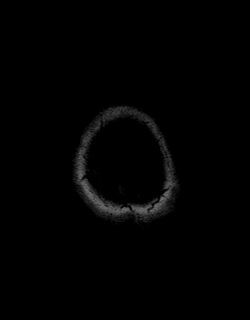

[Series 12: T2 · coronal · 5.0mm · 0.45mm/px · 2 of 27 slices shown (2 of 2)]
[im 1/27]
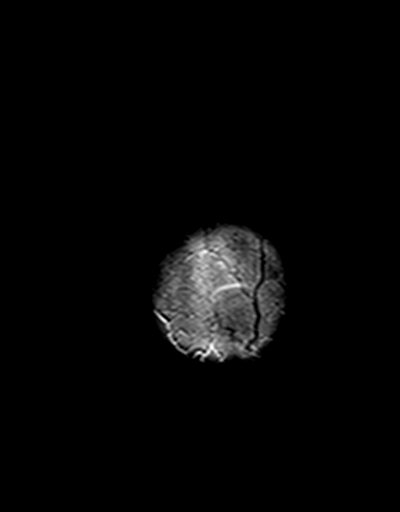
[im 27/27]
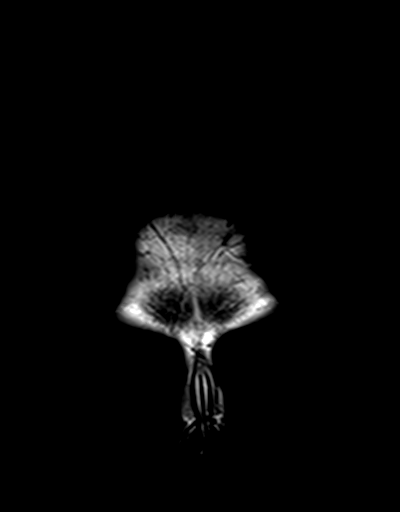

[Series 13: t1_mpr_tra post · axial · 1.0mm · 0.75mm/px · z∈[-33,+104]mm · 10 of 144 slices shown]
[im 1/144]
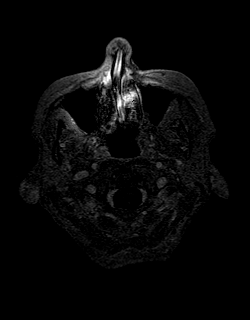
[im 16/144]
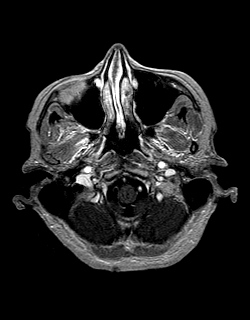
[im 32/144]
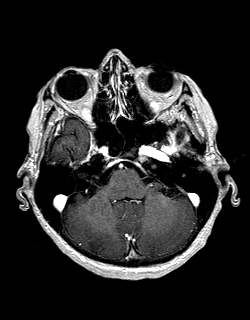
[im 48/144]
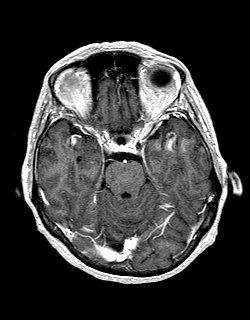
[im 64/144]
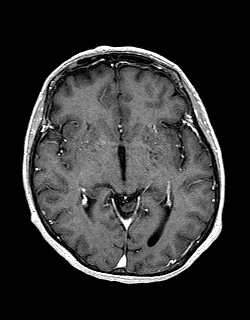
[im 80/144]
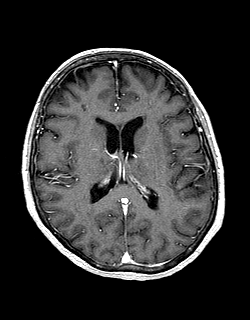
[im 96/144]
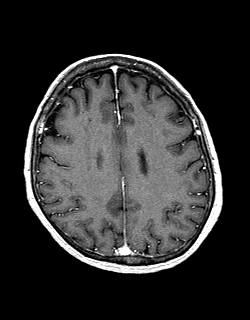
[im 112/144]
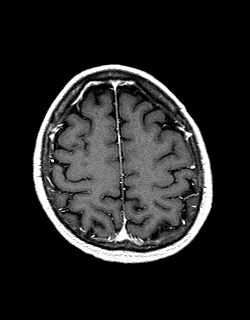
[im 128/144]
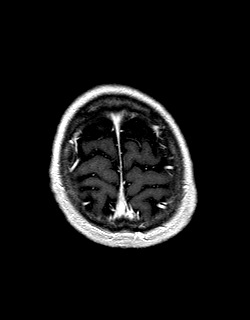
[im 144/144]
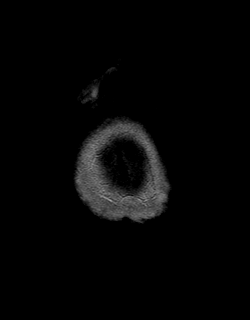

[Series 14: post cor · coronal · 5.0mm · 0.45mm/px · 2 of 26 slices shown]
[im 1/26]
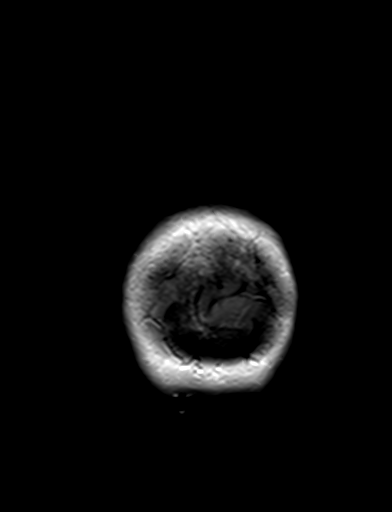
[im 26/26]
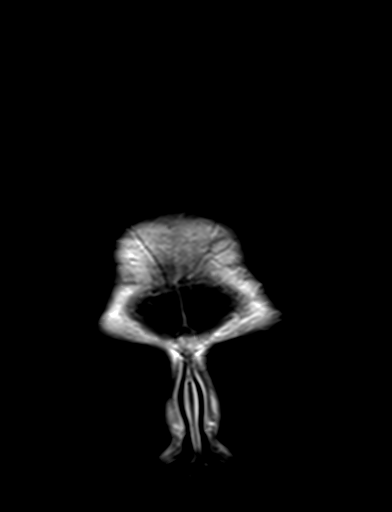

[48 of 48 positions shown; findings below may reference images not displayed]

FINDINGS: Brain:

Cerebral volume is normal for age.

No cortical encephalomalacia is identified.

Minimal scattered and ill-defined T2/FLAIR hyperintensity within the
cerebral white matter, nonspecific but compatible with chronic small
vessel ischemic disease.

There is no acute infarct.

No evidence of an intracranial mass.

Susceptibility artifact limits evaluation at the level of the lower
cerebrum and posterior fossa on the axial SWI sequence. Within this
limitation, there are no appreciable chronic intracranial blood
products.

No extra-axial fluid collection.

No midline shift.

No abnormal intracranial enhancement.

Vascular: Maintained proximal arterial flow voids.

Skull and upper cervical spine: No focal marrow lesion.

Sinuses/Orbits: Visualized orbits show no acute finding. No
significant paranasal sinus disease at the imaged levels.
IMPRESSION: No evidence of acute intracranial abnormality.

Minimal chronic small-vessel ischemic changes within the cerebral
white matter.
